# Patient Record
Sex: Female | Born: 1970 | Race: White | Hispanic: No | Marital: Single | Smoking: Never smoker
Health system: Southern US, Community
[De-identification: ages and names within clinical notes are randomized; demographics above are authoritative.]

## PROBLEM LIST (undated history)

## (undated) DIAGNOSIS — K219 Gastro-esophageal reflux disease without esophagitis: Secondary | ICD-10-CM

## (undated) DIAGNOSIS — E119 Type 2 diabetes mellitus without complications: Secondary | ICD-10-CM

## (undated) DIAGNOSIS — F329 Major depressive disorder, single episode, unspecified: Secondary | ICD-10-CM

## (undated) DIAGNOSIS — Z9889 Other specified postprocedural states: Secondary | ICD-10-CM

## (undated) DIAGNOSIS — R112 Nausea with vomiting, unspecified: Secondary | ICD-10-CM

## (undated) DIAGNOSIS — I10 Essential (primary) hypertension: Secondary | ICD-10-CM

## (undated) DIAGNOSIS — F32A Depression, unspecified: Secondary | ICD-10-CM

## (undated) DIAGNOSIS — T4145XA Adverse effect of unspecified anesthetic, initial encounter: Secondary | ICD-10-CM

## (undated) DIAGNOSIS — T8859XA Other complications of anesthesia, initial encounter: Secondary | ICD-10-CM

## (undated) HISTORY — PX: BREAST REDUCTION SURGERY: SHX8

## (undated) HISTORY — PX: FIBULA FRACTURE SURGERY: SHX947

## (undated) HISTORY — PX: ELBOW FRACTURE SURGERY: SHX616

## (undated) HISTORY — PX: REDUCTION MAMMAPLASTY: SUR839

---

## 2016-04-24 NOTE — Pre-Procedure Instructions (Signed)
   Jacqueline KleinKaryn Brock  04/24/2016      [facility] - [place] - [address] [place] [id number] Phone: [phone] Fax: [phone]    Your procedure is scheduled on Thursday, November 30th, 2017.  [facility] at 1:45 P.M.   Call this number if you have problems the morning of surgery:  [phone]   Remember:  Do not eat food or drink liquids after midnight.   Take these medicines the morning of surgery with A SIP OF WATER: Acetaminophen (Tylenol) if needed, Cetirizine (Zyrtec).   Do not wear jewelry, make-up or nail polish.  Do not wear lotions, powders, or perfumes, or deoderant.  Do not shave 48 hours prior to surgery.    Do not bring valuables to the hospital.  [facility] any belongings or valuables.  Contacts, dentures or bridgework may not be worn into surgery.  Leave your suitcase in the car.  After surgery it may be brought to your room.  For patients admitted to the hospital, discharge time will be determined by your treatment team.  Patients discharged the day of surgery will not be allowed to drive home.   Special instructions:  Preparing for Surgery.   [place]- Preparing For Surgery  Before surgery, you can play an important role. Because skin is not sterile, your skin needs to be as free of germs as possible. You can reduce the number of germs on your skin by washing with CHG (chlorahexidine gluconate) Soap before surgery.  CHG is an antiseptic cleaner which kills germs and bonds with the skin to continue killing germs even after washing.  Please do not use if you have an allergy to CHG or antibacterial soaps. If your skin becomes reddened/irritated stop using the CHG.  Do not shave (including legs and underarms) for at least 48 hours prior to first CHG shower. It is OK to shave your face.  Please follow these instructions carefully.   1. Shower the NIGHT BEFORE  SURGERY and the MORNING OF SURGERY with CHG.   2. If you chose to wash your hair, wash your hair first as usual with your normal shampoo.  3. After you shampoo, rinse your hair and body thoroughly to remove the shampoo.  4. Use CHG as you would any other liquid soap. You can apply CHG directly to the skin and wash gently with a scrungie or a clean washcloth.   5. Apply the CHG Soap to your body ONLY FROM THE NECK DOWN.  Do not use on open wounds or open sores. Avoid contact with your eyes, ears, mouth and genitals (private parts). Wash genitals (private parts) with your normal soap.  6. Wash thoroughly, paying special attention to the area where your surgery will be performed.  7. Thoroughly rinse your body with warm water from the neck down.  8. DO NOT shower/wash with your normal soap after using and rinsing off the CHG Soap.  9. Pat yourself dry with a CLEAN TOWEL.   10. Wear CLEAN PAJAMAS   11. Place CLEAN SHEETS on your bed the night of your first shower and DO NOT SLEEP WITH PETS.  Day of Surgery: Do not apply any deodorants/lotions. Please wear clean clothes to the hospital/surgery center.    Please read over the following fact sheets that you were given.

## 2016-04-26 ENCOUNTER — Ambulatory Visit: Payer: Self-pay | Admitting: Orthopedic Surgery

## 2016-04-27 ENCOUNTER — Encounter (HOSPITAL_COMMUNITY)
Admission: RE | Admit: 2016-04-27 | Discharge: 2016-04-27 | Disposition: A | Discharge status: Home / Self Care | Payer: BLUE CROSS/BLUE SHIELD | Source: Ambulatory Visit | Attending: Orthopedic Surgery | Admitting: Orthopedic Surgery

## 2016-04-27 ENCOUNTER — Encounter (HOSPITAL_COMMUNITY): Payer: Self-pay | Admitting: General Practice

## 2016-04-27 DIAGNOSIS — K219 Gastro-esophageal reflux disease without esophagitis: Secondary | ICD-10-CM | POA: Diagnosis not present

## 2016-04-27 DIAGNOSIS — Z01818 Encounter for other preprocedural examination: Secondary | ICD-10-CM | POA: Insufficient documentation

## 2016-04-27 DIAGNOSIS — S42292K Other displaced fracture of upper end of left humerus, subsequent encounter for fracture with nonunion: Secondary | ICD-10-CM | POA: Diagnosis present

## 2016-04-27 DIAGNOSIS — F329 Major depressive disorder, single episode, unspecified: Secondary | ICD-10-CM | POA: Diagnosis not present

## 2016-04-27 DIAGNOSIS — Z6841 Body Mass Index (BMI) 40.0 and over, adult: Secondary | ICD-10-CM | POA: Diagnosis not present

## 2016-04-27 DIAGNOSIS — X58XXXD Exposure to other specified factors, subsequent encounter: Secondary | ICD-10-CM | POA: Diagnosis not present

## 2016-04-27 DIAGNOSIS — I10 Essential (primary) hypertension: Secondary | ICD-10-CM | POA: Diagnosis not present

## 2016-04-27 HISTORY — DX: Other specified postprocedural states: Z98.890

## 2016-04-27 HISTORY — DX: Depression, unspecified: F32.A

## 2016-04-27 HISTORY — DX: Essential (primary) hypertension: I10

## 2016-04-27 HISTORY — DX: Other complications of anesthesia, initial encounter: T88.59XA

## 2016-04-27 HISTORY — DX: Major depressive disorder, single episode, unspecified: F32.9

## 2016-04-27 HISTORY — DX: Adverse effect of unspecified anesthetic, initial encounter: T41.45XA

## 2016-04-27 HISTORY — DX: Nausea with vomiting, unspecified: R11.2

## 2016-04-27 HISTORY — DX: Gastro-esophageal reflux disease without esophagitis: K21.9

## 2016-04-27 LAB — HCG, SERUM, QUALITATIVE: Preg, Serum: NEGATIVE

## 2016-04-27 LAB — BASIC METABOLIC PANEL
Anion gap: 10 (ref 5–15)
BUN: 10 mg/dL (ref 6–20)
CO2: 22 mmol/L (ref 22–32)
Calcium: 9.1 mg/dL (ref 8.9–10.3)
Chloride: 104 mmol/L (ref 101–111)
Creatinine, Ser: 0.61 mg/dL (ref 0.44–1.00)
GFR calc Af Amer: >60 mL/min (ref >60)
GFR calc non Af Amer: >60 mL/min (ref >60)
Glucose, Bld: 133 mg/dL — ABNORMAL HIGH (ref 65–99)
Potassium: 3.9 mmol/L (ref 3.5–5.1)
Sodium: 136 mmol/L (ref 135–145)

## 2016-04-27 LAB — CBC
HCT: 39.1 % (ref 36.0–46.0)
Hemoglobin: 12.9 g/dL (ref 12.0–15.0)
MCH: 26.4 pg (ref 26.0–34.0)
MCHC: 33 g/dL (ref 30.0–36.0)
MCV: 80 fL (ref 78.0–100.0)
Platelets: 285 10*3/uL (ref 150–400)
RBC: 4.89 MIL/uL (ref 3.87–5.11)
RDW: 15.1 % (ref 11.5–15.5)
WBC: 9.7 10*3/uL (ref 4.0–10.5)

## 2016-04-27 NOTE — Progress Notes (Signed)
PCP - Dr. Eyvonne Mechanicarolina Prochnau Cardiologist - denies  EKG -06/08/15 [facility] - denies Echo/stress test/cardiac cath - denies  Patient denies chest pain and shortness of breath at PAT appointment.

## 2016-04-29 MED ORDER — DEXTROSE 5 % IV SOLN
3.0000 g | INTRAVENOUS | Status: AC
  Administered 2016-04-30: 3 g via INTRAVENOUS
  Filled 2016-04-29: qty 3000

## 2016-04-30 ENCOUNTER — Ambulatory Visit (HOSPITAL_COMMUNITY): Payer: BLUE CROSS/BLUE SHIELD | Admitting: Anesthesiology

## 2016-04-30 ENCOUNTER — Encounter (HOSPITAL_COMMUNITY): Payer: Self-pay | Admitting: Surgery

## 2016-04-30 ENCOUNTER — Encounter (HOSPITAL_COMMUNITY)
Admission: RE | Disposition: A | Discharge status: Home / Self Care | Payer: Self-pay | Source: Ambulatory Visit | Attending: Orthopedic Surgery

## 2016-04-30 ENCOUNTER — Observation Stay (HOSPITAL_COMMUNITY)
Admission: RE | Admit: 2016-04-30 | Discharge: 2016-05-02 | Disposition: A | Discharge status: Home / Self Care | Payer: BLUE CROSS/BLUE SHIELD | Source: Ambulatory Visit | Attending: Orthopedic Surgery | Admitting: Orthopedic Surgery

## 2016-04-30 DIAGNOSIS — K219 Gastro-esophageal reflux disease without esophagitis: Secondary | ICD-10-CM | POA: Insufficient documentation

## 2016-04-30 DIAGNOSIS — I10 Essential (primary) hypertension: Secondary | ICD-10-CM | POA: Diagnosis not present

## 2016-04-30 DIAGNOSIS — S42292K Other displaced fracture of upper end of left humerus, subsequent encounter for fracture with nonunion: Principal | ICD-10-CM | POA: Insufficient documentation

## 2016-04-30 DIAGNOSIS — S42209K Unspecified fracture of upper end of unspecified humerus, subsequent encounter for fracture with nonunion: Secondary | ICD-10-CM | POA: Diagnosis present

## 2016-04-30 DIAGNOSIS — Z6841 Body Mass Index (BMI) 40.0 and over, adult: Secondary | ICD-10-CM | POA: Insufficient documentation

## 2016-04-30 DIAGNOSIS — S42302A Unspecified fracture of shaft of humerus, left arm, initial encounter for closed fracture: Secondary | ICD-10-CM

## 2016-04-30 DIAGNOSIS — X58XXXD Exposure to other specified factors, subsequent encounter: Secondary | ICD-10-CM | POA: Insufficient documentation

## 2016-04-30 DIAGNOSIS — F329 Major depressive disorder, single episode, unspecified: Secondary | ICD-10-CM | POA: Diagnosis not present

## 2016-04-30 HISTORY — PX: ORIF HUMERUS FRACTURE: SHX2126

## 2016-04-30 SURGERY — OPEN REDUCTION INTERNAL FIXATION (ORIF) PROXIMAL HUMERUS FRACTURE
Anesthesia: Regional | Site: Arm Upper | Laterality: Left

## 2016-04-30 MED ORDER — DIPHENHYDRAMINE HCL 25 MG PO CAPS: 25.0000 mg | ORAL_CAPSULE | Freq: Four times a day (QID) | ORAL | Status: DC | PRN

## 2016-04-30 MED ORDER — SUGAMMADEX SODIUM 200 MG/2ML IV SOLN
INTRAVENOUS | Status: DC | PRN
  Administered 2016-04-30: 281.2 mg via INTRAVENOUS

## 2016-04-30 MED ORDER — MIDAZOLAM HCL 2 MG/2ML IJ SOLN
INTRAMUSCULAR | Status: DC | PRN
  Administered 2016-04-30: 2 mg via INTRAVENOUS

## 2016-04-30 MED ORDER — ADULT MULTIVITAMIN W/MINERALS CH
1.0000 | ORAL_TABLET | Freq: Every day | ORAL | Status: DC
  Filled 2016-04-30 (×2): qty 1

## 2016-04-30 MED ORDER — HYDROCHLOROTHIAZIDE 12.5 MG PO CAPS
12.5000 mg | ORAL_CAPSULE | Freq: Every day | ORAL | Status: DC
  Administered 2016-04-30 – 2016-05-01 (×2): 12.5 mg via ORAL
  Filled 2016-04-30 (×2): qty 1

## 2016-04-30 MED ORDER — OXYCODONE HCL 5 MG PO TABS: 5.0000 mg | ORAL_TABLET | Freq: Once | ORAL | Status: DC | PRN

## 2016-04-30 MED ORDER — BUPIVACAINE-EPINEPHRINE (PF) 0.5% -1:200000 IJ SOLN
INTRAMUSCULAR | Status: DC | PRN
  Administered 2016-04-30: 15 mL via PERINEURAL

## 2016-04-30 MED ORDER — OXYCODONE HCL 5 MG PO TABS
5.0000 mg | ORAL_TABLET | ORAL | Status: DC | PRN
  Administered 2016-05-01: 10 mg via ORAL
  Administered 2016-05-01 (×4): 5 mg via ORAL
  Administered 2016-05-02 (×2): 10 mg via ORAL
  Filled 2016-04-30 (×3): qty 2
  Filled 2016-04-30 (×4): qty 1

## 2016-04-30 MED ORDER — ONDANSETRON HCL 4 MG/2ML IJ SOLN
4.0000 mg | Freq: Four times a day (QID) | INTRAMUSCULAR | Status: DC | PRN
  Administered 2016-04-30: 4 mg via INTRAVENOUS
  Filled 2016-04-30: qty 2

## 2016-04-30 MED ORDER — METHOCARBAMOL 1000 MG/10ML IJ SOLN
500.0000 mg | Freq: Four times a day (QID) | INTRAVENOUS | Status: DC | PRN
  Filled 2016-04-30: qty 5

## 2016-04-30 MED ORDER — FENTANYL CITRATE (PF) 100 MCG/2ML IJ SOLN
INTRAMUSCULAR | Status: AC
  Filled 2016-04-30: qty 2

## 2016-04-30 MED ORDER — LACTATED RINGERS IV SOLN
INTRAVENOUS | Status: DC | PRN
  Administered 2016-04-30 (×3): via INTRAVENOUS

## 2016-04-30 MED ORDER — CEFAZOLIN IN D5W 1 GM/50ML IV SOLN
1.0000 g | Freq: Three times a day (TID) | INTRAVENOUS | Status: DC
  Administered 2016-05-01 – 2016-05-02 (×5): 1 g via INTRAVENOUS
  Filled 2016-04-30 (×6): qty 50

## 2016-04-30 MED ORDER — IRBESARTAN 150 MG PO TABS
150.0000 mg | ORAL_TABLET | Freq: Every day | ORAL | Status: DC
  Administered 2016-04-30 – 2016-05-01 (×2): 150 mg via ORAL
  Filled 2016-04-30 (×2): qty 1

## 2016-04-30 MED ORDER — SCOPOLAMINE 1 MG/3DAYS TD PT72
MEDICATED_PATCH | TRANSDERMAL | Status: DC | PRN
  Administered 2016-04-30: 1 via TRANSDERMAL

## 2016-04-30 MED ORDER — OXYCODONE HCL 5 MG/5ML PO SOLN: 5.0000 mg | Freq: Once | ORAL | Status: DC | PRN

## 2016-04-30 MED ORDER — ONDANSETRON HCL 4 MG/2ML IJ SOLN
INTRAMUSCULAR | Status: DC | PRN
  Administered 2016-04-30: 4 mg via INTRAVENOUS

## 2016-04-30 MED ORDER — PROPOFOL 10 MG/ML IV BOLUS
INTRAVENOUS | Status: DC | PRN
  Administered 2016-04-30: 200 mg via INTRAVENOUS

## 2016-04-30 MED ORDER — SODIUM CHLORIDE 0.45 % IV SOLN
INTRAVENOUS | Status: DC
  Administered 2016-05-01: 06:00:00 via INTRAVENOUS

## 2016-04-30 MED ORDER — CEFAZOLIN IN D5W 1 GM/50ML IV SOLN
1.0000 g | INTRAVENOUS | Status: AC
  Administered 2016-04-30: 1 g via INTRAVENOUS
  Filled 2016-04-30: qty 50

## 2016-04-30 MED ORDER — VALSARTAN-HYDROCHLOROTHIAZIDE 160-12.5 MG PO TABS: 1.0000 | ORAL_TABLET | Freq: Every day | ORAL | Status: DC

## 2016-04-30 MED ORDER — PHENYLEPHRINE HCL 10 MG/ML IJ SOLN
INTRAMUSCULAR | Status: DC | PRN
  Administered 2016-04-30: 80 ug via INTRAVENOUS

## 2016-04-30 MED ORDER — ESCITALOPRAM OXALATE 10 MG PO TABS
10.0000 mg | ORAL_TABLET | Freq: Every day | ORAL | Status: DC
  Administered 2016-04-30 – 2016-05-01 (×2): 10 mg via ORAL
  Filled 2016-04-30 (×2): qty 1

## 2016-04-30 MED ORDER — LIDOCAINE HCL (CARDIAC) 20 MG/ML IV SOLN
INTRAVENOUS | Status: DC | PRN
  Administered 2016-04-30: 50 mg via INTRAVENOUS

## 2016-04-30 MED ORDER — ALPRAZOLAM 0.5 MG PO TABS: 0.5000 mg | ORAL_TABLET | Freq: Four times a day (QID) | ORAL | Status: DC | PRN

## 2016-04-30 MED ORDER — EPINEPHRINE PF 1 MG/ML IJ SOLN
INTRAMUSCULAR | Status: AC
  Filled 2016-04-30: qty 1

## 2016-04-30 MED ORDER — ONDANSETRON HCL 4 MG PO TABS
4.0000 mg | ORAL_TABLET | Freq: Four times a day (QID) | ORAL | Status: DC | PRN
  Administered 2016-05-01: 4 mg via ORAL
  Filled 2016-04-30: qty 1

## 2016-04-30 MED ORDER — 0.9 % SODIUM CHLORIDE (POUR BTL) OPTIME
TOPICAL | Status: DC | PRN
  Administered 2016-04-30: 1000 mL

## 2016-04-30 MED ORDER — METHOCARBAMOL 500 MG PO TABS
500.0000 mg | ORAL_TABLET | Freq: Four times a day (QID) | ORAL | Status: DC | PRN
  Administered 2016-05-01 (×2): 500 mg via ORAL
  Filled 2016-04-30 (×2): qty 1

## 2016-04-30 MED ORDER — MORPHINE SULFATE (PF) 2 MG/ML IV SOLN: 1.0000 mg | INTRAVENOUS | Status: DC | PRN

## 2016-04-30 MED ORDER — PANTOPRAZOLE SODIUM 40 MG PO TBEC
40.0000 mg | DELAYED_RELEASE_TABLET | Freq: Every day | ORAL | Status: DC
  Administered 2016-04-30 – 2016-05-01 (×2): 40 mg via ORAL
  Filled 2016-04-30 (×2): qty 1

## 2016-04-30 MED ORDER — MIDAZOLAM HCL 2 MG/2ML IJ SOLN
INTRAMUSCULAR | Status: AC
  Filled 2016-04-30: qty 2

## 2016-04-30 MED ORDER — BUPIVACAINE HCL (PF) 0.5 % IJ SOLN
INTRAMUSCULAR | Status: AC
  Filled 2016-04-30: qty 30

## 2016-04-30 MED ORDER — ROCURONIUM BROMIDE 100 MG/10ML IV SOLN
INTRAVENOUS | Status: DC | PRN
  Administered 2016-04-30: 50 mg via INTRAVENOUS

## 2016-04-30 MED ORDER — SENNA 8.6 MG PO TABS
1.0000 | ORAL_TABLET | Freq: Two times a day (BID) | ORAL | Status: DC
  Administered 2016-05-01 (×2): 8.6 mg via ORAL
  Filled 2016-04-30 (×3): qty 1

## 2016-04-30 MED ORDER — HYDROMORPHONE HCL 1 MG/ML IJ SOLN: 0.2500 mg | INTRAMUSCULAR | Status: DC | PRN

## 2016-04-30 MED ORDER — CHLORHEXIDINE GLUCONATE 4 % EX LIQD: 60.0000 mL | Freq: Once | CUTANEOUS | Status: DC

## 2016-04-30 MED ORDER — FENTANYL CITRATE (PF) 100 MCG/2ML IJ SOLN
INTRAMUSCULAR | Status: DC | PRN
  Administered 2016-04-30 (×3): 100 ug via INTRAVENOUS

## 2016-04-30 SURGICAL SUPPLY — 69 items
BANDAGE ELASTIC 4 VELCRO ST LF (GAUZE/BANDAGES/DRESSINGS) ×2 IMPLANT
BANDAGE ELASTIC 6 VELCRO ST LF (GAUZE/BANDAGES/DRESSINGS) ×2 IMPLANT
BIT DRILL QC 3.3X195 (BIT) ×2 IMPLANT
BNDG CONFORM 3 STRL LF (GAUZE/BANDAGES/DRESSINGS) ×6 IMPLANT
BNDG GAUZE ELAST 4 BULKY (GAUZE/BANDAGES/DRESSINGS) ×4 IMPLANT
CAP LOCK NCB (Cap) ×20 IMPLANT
COVER SURGICAL LIGHT HANDLE (MISCELLANEOUS) ×2 IMPLANT
DRAPE C-ARM 42X72 X-RAY (DRAPES) ×2 IMPLANT
DRAPE EXTREMITY T 121X128X90 (DRAPE) ×2 IMPLANT
DRAPE IMP U-DRAPE 54X76 (DRAPES) ×2 IMPLANT
DRAPE INCISE IOBAN 66X45 STRL (DRAPES) ×2 IMPLANT
DRAPE POUCH INSTRU U-SHP 10X18 (DRAPES) ×2 IMPLANT
DRAPE U-SHAPE 47X51 STRL (DRAPES) ×2 IMPLANT
DRSG EMULSION OIL 3X3 NADH (GAUZE/BANDAGES/DRESSINGS) ×2 IMPLANT
DRSG MEPILEX BORDER 4X12 (GAUZE/BANDAGES/DRESSINGS) ×2 IMPLANT
DRSG PAD ABDOMINAL 8X10 ST (GAUZE/BANDAGES/DRESSINGS) ×2 IMPLANT
ELECT REM PT RETURN 9FT ADLT (ELECTROSURGICAL) ×2
ELECTRODE REM PT RTRN 9FT ADLT (ELECTROSURGICAL) ×1 IMPLANT
GAUZE SPONGE 4X4 12PLY STRL (GAUZE/BANDAGES/DRESSINGS) ×2 IMPLANT
GLOVE BIO SURGEON STRL SZ7.5 (GLOVE) ×2 IMPLANT
GLOVE BIOGEL M 8.0 STRL (GLOVE) ×2 IMPLANT
GOWN STRL REUS W/ TWL LRG LVL3 (GOWN DISPOSABLE) ×2 IMPLANT
GOWN STRL REUS W/ TWL XL LVL3 (GOWN DISPOSABLE) ×2 IMPLANT
GOWN STRL REUS W/TWL LRG LVL3 (GOWN DISPOSABLE) ×2
GOWN STRL REUS W/TWL XL LVL3 (GOWN DISPOSABLE) ×2
KIT BASIN OR (CUSTOM PROCEDURE TRAY) ×2 IMPLANT
KIT INFUSE MEDIUM (Orthopedic Implant) ×2 IMPLANT
KIT ROOM TURNOVER OR (KITS) ×2 IMPLANT
MANIFOLD NEPTUNE II (INSTRUMENTS) ×2 IMPLANT
NDL SUT 6 .5 CRC .975X.05 MAYO (NEEDLE) ×1 IMPLANT
NEEDLE 22X1 1/2 (OR ONLY) (NEEDLE) IMPLANT
NEEDLE MAYO TAPER (NEEDLE) ×1
NS IRRIG 1000ML POUR BTL (IV SOLUTION) ×2 IMPLANT
PACK SHOULDER (CUSTOM PROCEDURE TRAY) ×2 IMPLANT
PACK UNIVERSAL I (CUSTOM PROCEDURE TRAY) ×2 IMPLANT
PAD ARMBOARD 7.5X6 YLW CONV (MISCELLANEOUS) ×4 IMPLANT
PAD CAST 4YDX4 CTTN HI CHSV (CAST SUPPLIES) ×1 IMPLANT
PADDING CAST COTTON 4X4 STRL (CAST SUPPLIES) ×1
PADDING CAST COTTON 6X4 STRL (CAST SUPPLIES) ×2 IMPLANT
PLATE FRACTURE NCB PA 174 H12 (Plate) ×2 IMPLANT
SCREW NCB 4.0 22MM (Screw) ×2 IMPLANT
SCREW NCB 4.0 28MM (Screw) ×4 IMPLANT
SCREW NCB 4.0X20MM (Screw) ×6 IMPLANT
SCREW NCB 4.0X24MM (Screw) ×4 IMPLANT
SCREW NCB 4.0X26MM (Screw) ×2 IMPLANT
SLING ARM FOAM STRAP LRG (SOFTGOODS) ×2 IMPLANT
SPLINT FIBERGLASS 3X35 (CAST SUPPLIES) ×2 IMPLANT
SPLINT FIBERGLASS 4X30 (CAST SUPPLIES) ×2 IMPLANT
SPONGE LAP 18X18 X RAY DECT (DISPOSABLE) ×6 IMPLANT
SPONGE LAP 4X18 X RAY DECT (DISPOSABLE) ×4 IMPLANT
STRIP CLOSURE SKIN 1/2X4 (GAUZE/BANDAGES/DRESSINGS) ×4 IMPLANT
SUCTION FRAZIER HANDLE 10FR (MISCELLANEOUS) ×1
SUCTION TUBE FRAZIER 10FR DISP (MISCELLANEOUS) ×1 IMPLANT
SUT BONE WAX W31G (SUTURE) IMPLANT
SUT ETHIBOND NAB CT1 #1 30IN (SUTURE) ×4 IMPLANT
SUT FIBERWIRE #2 38 REV NDL BL (SUTURE)
SUT MNCRL AB 3-0 PS2 18 (SUTURE) ×2 IMPLANT
SUT PROLENE 3 0 PS 1 (SUTURE) ×2 IMPLANT
SUT VIC AB 0 CTB1 27 (SUTURE) ×2 IMPLANT
SUT VIC AB 2-0 CT1 27 (SUTURE) ×2
SUT VIC AB 2-0 CT1 TAPERPNT 27 (SUTURE) ×2 IMPLANT
SUT VIC AB 2-0 CTB1 (SUTURE) ×6 IMPLANT
SUT VICRYL 4-0 PS2 18IN ABS (SUTURE) ×2 IMPLANT
SUTURE FIBERWR#2 38 REV NDL BL (SUTURE) IMPLANT
SYR CONTROL 10ML LL (SYRINGE) ×2 IMPLANT
TOWEL OR 17X24 6PK STRL BLUE (TOWEL DISPOSABLE) ×2 IMPLANT
TOWEL OR 17X26 10 PK STRL BLUE (TOWEL DISPOSABLE) ×2 IMPLANT
WATER STERILE IRR 1000ML POUR (IV SOLUTION) ×2 IMPLANT
YANKAUER SUCT BULB TIP NO VENT (SUCTIONS) IMPLANT

## 2016-04-30 NOTE — H&P (Signed)
Colvin CaroliKaryn Brock is an 45 y.o. female.   Chief Complaint: left humerus nonunion HPI: Patient presents for surgical trearment of of left humerus nonunion  Patient presents for evaluation and treatment of the of their upper extremity predicament. The patient denies neck, back, chest or  abdominal pain. The patient notes that they have no lower extremity problems. The patients primary complaint is noted. We are planning surgical care pathway for the upper extremity.  Past Medical History:  Diagnosis Date  . Complication of anesthesia    difficulty waking up  . Depression   . GERD (gastroesophageal reflux disease)   . Hypertension   . PONV (postoperative nausea and vomiting)    "scopolomine patch works well"    Past Surgical History:  Procedure Laterality Date  . BREAST REDUCTION SURGERY    . ELBOW FRACTURE SURGERY Left    with hardware placement  . FIBULA FRACTURE SURGERY Right    non union with bone graft    No family history on file. Social History:  reports that she has never smoked. She has never used smokeless tobacco. She reports that she drinks alcohol. She reports that she does not use drugs.  Allergies: No Known Allergies  No prescriptions prior to admission.    No results found for this or any previous visit (from the past 48 hour(s)). No results found.  Review of Systems  Constitutional: Negative.   HENT: Negative.   Eyes: Negative.   Respiratory: Negative.   Cardiovascular: Negative.   Gastrointestinal: Negative.   Genitourinary: Negative.   Skin: Negative.     Last menstrual period 04/06/2016. 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 The patient complains of pain in the affected upper extremity.  The patient is noted to have a normal HEENT exam. Lung fields show equal chest expansion and no shortness of breath. Abdomen exam is  nontender without distention. Lower extremity examination does not show any fracture dislocation or blood clot symptoms. Pelvis is stable and the neck and back are stable and nontender. 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 The risk and benefits of surgery to include risk of bleeding, infection, anesthesia,  damage to normal structures and failure of the surgery to accomplish its intended goals of relieving symptoms and restoring function have been discussed in detail. With this in mind we plan to proceed. I have specifically discussed with the patient the pre-and postoperative regime and the dos and don'ts and risk and benefits in great detail. Risk and benefits of surgery also include risk of dystrophy(CRPS), chronic nerve pain, failure of the healing process to go onto completion and other inherent risks of surgery The relavent the pathophysiology of the disease/injury process, as well as the alternatives for treatment and postoperative course of action has been discussed in great detail with the patient who desires to proceed.  We will do everything in our power to help you (the patient) restore function to the upper extremity. It is a pleasure to see this patient today.   [name],Jacqueline Brock, Jacqueline Brock 04/30/2016, 6:42 AM

## 2016-04-30 NOTE — Anesthesia Procedure Notes (Signed)
Procedure Name: Intubation Date/Time: 04/30/2016 5:08 PM Performed by: Shona SimpsonHOLLIS, Jacqueline Brock Pre-anesthesia Checklist: Patient identified, Patient being monitored, Timeout performed, Emergency Drugs available and Suction available Patient Re-evaluated:Patient Re-evaluated prior to inductionOxygen Delivery Method: Circle system utilized Preoxygenation: Pre-oxygenation with 100% oxygen Intubation Type: IV induction Ventilation: Mask ventilation without difficulty Laryngoscope Size: Miller and 2 Grade View: Grade I Tube type: Oral Tube size: 7.0 mm Number of attempts: 1 Airway Equipment and Method: Stylet Placement Confirmation: ETT inserted through vocal cords under direct vision,  positive ETCO2 and breath sounds checked- equal and bilateral Secured at: 21 cm Tube secured with: Tape Dental Injury: Teeth and Oropharynx as per pre-operative assessment

## 2016-04-30 NOTE — Op Note (Signed)
Please see full [phone]  Status post open reduction internal fixation left humerus nonunion with 12 hole  NCB plate from [employer] with 4.0 screws and locking caps. Radial nerve neurolysis and exploration.  Stress radiograph. Bone graft- infuse BMP bone graft and local autologous graft.   Surgeon Dominica SeverinWilliam Manda Holstad Asst. Karie ChimeraBrian Buchanan PA-C

## 2016-04-30 NOTE — Progress Notes (Signed)
Pt awake,alert, pain free., no c/o. BP stable, ST 115-122-Dr R, Fitzgerald here & aware-no new orders, OK to tx pt to floor.

## 2016-04-30 NOTE — Anesthesia Preprocedure Evaluation (Signed)
Anesthesia Evaluation  Patient identified by MRN, date of birth, ID band Patient awake    Reviewed: Allergy & Precautions, NPO status , Patient's Chart, lab work & pertinent test results  History of Anesthesia Complications (+) PONV and history of anesthetic complications  Airway Mallampati: II  TM Distance: >3 FB Neck ROM: Full    Dental  (+) Teeth Intact   Pulmonary neg pulmonary ROS,    breath sounds clear to auscultation       Cardiovascular hypertension, Pt. on medications  Rhythm:Regular     Neuro/Psych PSYCHIATRIC DISORDERS Depression negative neurological ROS     GI/Hepatic Neg liver ROS, GERD  Medicated and Controlled,  Endo/Other  Morbid obesity  Renal/GU negative Renal ROS     Musculoskeletal   Abdominal   Peds  Hematology negative hematology ROS (+)   Anesthesia Other Findings   Reproductive/Obstetrics                             Anesthesia Physical Anesthesia Plan  ASA: II  Anesthesia Plan: General and Regional   Post-op Pain Management:    Induction: Intravenous  Airway Management Planned: Oral ETT  Additional Equipment: None  Intra-op Plan:   Post-operative Plan: Extubation in OR  Informed Consent: I have reviewed the patients History and Physical, chart, labs and discussed the procedure including the risks, benefits and alternatives for the proposed anesthesia with the patient or authorized representative who has indicated his/her understanding and acceptance.   Dental advisory given  Plan Discussed with: Surgeon and CRNA  Anesthesia Plan Comments:         Anesthesia Quick Evaluation

## 2016-04-30 NOTE — Anesthesia Procedure Notes (Signed)
Procedure Name: Intubation Date/Time: 04/30/2016 5:03 PM Performed by: Gwenyth AllegraADAMI, Mehdi Gironda Pre-anesthesia Checklist: Patient identified, Emergency Drugs available, Suction available, Patient being monitored and Timeout performed Patient Re-evaluated:Patient Re-evaluated prior to inductionOxygen Delivery Method: Circle system utilized Preoxygenation: Pre-oxygenation with 100% oxygen Intubation Type: IV induction Ventilation: Mask ventilation without difficulty Laryngoscope Size: Miller and 3 Grade View: Grade I Tube type: Oral Tube size: 7.0 mm Number of attempts: 1 Airway Equipment and Method: Stylet Placement Confirmation: ETT inserted through vocal cords under direct vision Secured at: 21 cm Tube secured with: Tape Dental Injury: Teeth and Oropharynx as per pre-operative assessment

## 2016-04-30 NOTE — Anesthesia Procedure Notes (Signed)
Anesthesia Regional Block:  Interscalene brachial plexus block  Pre-Anesthetic Checklist: ,, timeout performed, Correct Patient, Correct Site, Correct Laterality, Correct Procedure, Correct Position, site marked, Risks and benefits discussed,  Surgical consent,  Pre-op evaluation,  At surgeon's request and post-op pain management  Laterality: Left  Prep: chloraprep       Needles:  Injection technique: Single-shot  Needle Type: Echogenic Needle     Needle Length: 9cm 9 cm Needle Gauge: 21 and 21 G    Additional Needles:  Procedures: ultrasound guided (picture in chart) Interscalene brachial plexus block Narrative:  Start time: 04/30/2016 4:55 PM End time: 04/30/2016 5:00 PM Injection made incrementally with aspirations every 5 mL.  Performed by: Personally  Anesthesiologist: Shona SimpsonHOLLIS, Ezequiel Macauley D  Additional Notes: No immediate complications. Pt tolerated well.

## 2016-04-30 NOTE — Transfer of Care (Signed)
Immediate Anesthesia Transfer of Care Note  Patient: Jacqueline Brock  Procedure(s) Performed: Procedure(s): OPEN REDUCTION INTERNAL FIXATION (ORIF)  LEFT HUMEROUS NONUNION  WITH ALLOGRAFT BONE GRAFT WITH REPAIR AS NECESSARY PROXIMAL HUMERUS FRACTURE (Left)  Patient Location: PACU  Anesthesia Type:GA combined with regional for post-op pain  Level of Consciousness: awake, alert  and oriented  Airway & Oxygen Therapy: Patient Spontanous Breathing and Patient connected to nasal cannula oxygen  Post-op Assessment: Report given to RN and Post -op Vital signs reviewed and stable  Post vital signs: Reviewed and stable  Last Vitals:  Vitals:   04/30/16 1353  BP: 138/67  Pulse: 94  Resp: 20  Temp: 37 C    Last Pain:  Vitals:   04/30/16 1412  TempSrc:   PainSc: 5       Patients Stated Pain Goal: 5 (04/30/16 1412)  Complications: No apparent anesthesia complications

## 2016-04-30 NOTE — Anesthesia Postprocedure Evaluation (Signed)
Anesthesia Post Note  Patient: Colvin CaroliKaryn Cox  Procedure(s) Performed: Procedure(s) (LRB): OPEN REDUCTION INTERNAL FIXATION (ORIF)  LEFT HUMEROUS NONUNION  WITH ALLOGRAFT BONE GRAFT WITH REPAIR AS NECESSARY PROXIMAL HUMERUS FRACTURE (Left)  Patient location during evaluation: PACU Anesthesia Type: General and Regional Level of consciousness: awake and alert Pain management: pain level controlled Vital Signs Assessment: post-procedure vital signs reviewed and stable Respiratory status: spontaneous breathing, nonlabored ventilation, respiratory function stable and patient connected to nasal cannula oxygen Cardiovascular status: blood pressure returned to baseline and stable Postop Assessment: no signs of nausea or vomiting Anesthetic complications: no    Last Vitals:  Vitals:   04/30/16 2100 04/30/16 2120  BP:  119/78  Pulse: (!) 115 87  Resp: (!) 21 18  Temp: 36.9 C 37.1 C    Last Pain:  Vitals:   04/30/16 2120  TempSrc: Oral  PainSc:                  Kennieth RadFitzgerald, Saeed Toren E

## 2016-05-01 DIAGNOSIS — S42292K Other displaced fracture of upper end of left humerus, subsequent encounter for fracture with nonunion: Secondary | ICD-10-CM | POA: Diagnosis not present

## 2016-05-01 NOTE — Progress Notes (Signed)
Subjective: 1 Day Post-Op Procedure(s) (LRB): OPEN REDUCTION INTERNAL FIXATION (ORIF)  LEFT HUMEROUS NONUNION  WITH ALLOGRAFT BONE GRAFT WITH REPAIR AS NECESSARY PROXIMAL HUMERUS FRACTURE (Left) Patient reports pain as mild.   Vital signs are stable patient denies locking popping catching. She moves her fingers beautifully. Radial nerve is intact.  She is tolerating her  diet. She is voiding well. Overall she looks great given the severity of her injury/humeral nonunion and the operation performed yesterday.  Objective: Vital signs in last 24 hours: Temp:  [98.7 F (37.1 C)-99 F (37.2 C)] 99 F (37.2 C) (12/01 2000) Pulse Rate:  [87-106] 100 (12/01 2000) Resp:  [18] 18 (12/01 2000) BP: (119-126)/(65-78) 126/65 (12/01 2000) SpO2:  [96 %-97 %] 97 % (12/01 2000)  Intake/Output from previous day: 11/30 0701 - 12/01 0700 In: 2987.2 [P.O.:270; I.V.:2617.2; IV Piggyback:100] Out: 100 [Blood:100] Intake/Output this shift: No intake/output data recorded.  No results for input(s): HGB in the last 72 hours. No results for input(s): WBC, RBC, HCT, PLT in the last 72 hours. No results for input(s): NA, K, CL, CO2, BUN, CREATININE, GLUCOSE, CALCIUM in the last 72 hours. No results for input(s): LABPT, INR in the last 72 hours. Physical exam she is alert and oriented in no acute distress looking quite well. Once again her radial median and ulnar nerve motor function is intact left upper extremity. No signs of compartments M. She is noted to have a normal exam in terms of capillary refill etc. Neurologically intact ABD soft Neurovascular intact Sensation intact distally Intact pulses distally No cellulitis present Compartment soft  Assessment/Plan: 1 Day Post-Op Procedure(s) (LRB): OPEN REDUCTION INTERNAL FIXATION (ORIF)  LEFT HUMEROUS NONUNION  WITH ALLOGRAFT BONE GRAFT WITH REPAIR AS NECESSARY PROXIMAL HUMERUS FRACTURE (Left) Plan for discharge tomorrow  Continue IV management. Continue  IV antibiotics. Continue pain management.  [name],Algernon Mundie M 05/01/2016, 9:05 PM

## 2016-05-01 NOTE — Evaluation (Signed)
Occupational Therapy Evaluation Patient Details Name: Jacqueline Brock MRN: [id number] DOB: [birth year] Today's Date: 05/01/2016    History of Present Illness Status post open reduction internal fixation left humerus nonunion   Clinical Impression   Pt with decline in function and safety with ADLs and ADL mobility with decreased endurance and limited by pain; decreased/limited L UE function, ROM. 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 Pt's best friend present and can stay with her for 2-3 days after d/c. Pt would benefit from acute OT services to address impairments to increase level of function and safety    Follow Up Recommendations  Home health OT    Equipment Recommendations  Other (comment) (reacher, LH sponge)    Recommendations for Other Services       Precautions / Restrictions Precautions Precautions: Shoulder;Other (comment) (humerus) Type of Shoulder Precautions: soft cast/splint Shoulder Interventions: At all times;Off for dressing/bathing/exercises;Shoulder sling/immobilizer Precaution Booklet Issued: Yes (comment) Precaution Comments: NWB, finger ROM only Restrictions Weight Bearing Restrictions: Yes LUE Weight Bearing: Non weight bearing      Mobility Bed Mobility Overal bed mobility: Needs Assistance Bed Mobility: Supine to Sit;Sit to Supine     Supine to sit: Min assist;HOB elevated Sit to supine: Supervision   General bed mobility comments: assist to elevate trunk  Transfers Overall transfer level: Modified independent Equipment used: None             General transfer comment: no pyhsical assist from OT    Balance Overall balance assessment: No apparent balance deficits (not formally assessed)                                          ADL Overall ADL's : Needs assistance/impaired     Grooming: Wash/dry hands;Wash/dry face;Standing;Set up   Upper Body Bathing:  Minimal assitance   Lower Body Bathing: Minimal assistance   Upper Body Dressing : Minimal assistance   Lower Body Dressing: Minimal assistance   Toilet Transfer: Modified Independent   Toileting- Clothing Manipulation and Hygiene: Minimal assistance;Sit to/from stand       Functional mobility during ADLs: Modified independent General ADL Comments: pt and her friend educated on ADL techniques     Vision  wears contact lenses and glasses, no change from baseline              Pertinent Vitals/Pain Pain Assessment: 0-10 Pain Score: 4  Pain Location: L shoulder Pain Descriptors / Indicators: Constant;Aching;Heaviness Pain Intervention(s): Limited activity within patient's tolerance;Monitored during session;Premedicated before session;Repositioned;Ice applied     Hand Dominance Right   Extremity/Trunk Assessment Upper Extremity Assessment Upper Extremity Assessment: Generalized weakness;LUE deficits/detail LUE Deficits / Details: soft cast/slpint, sling LUE: Unable to fully assess due to immobilization       Cervical / Trunk Assessment Cervical / Trunk Assessment: Normal   Communication Communication Communication: No difficulties   Cognition Arousal/Alertness: Awake/alert Behavior During Therapy: WFL for tasks assessed/performed Overall Cognitive Status: Within Functional Limits for tasks assessed                     General Comments   Pt very pleasant and cooperative, friend very supportive    Exercises   Other Exercises Other Exercises: ROM of L digits         Home Living Family/patient expects to be  discharged to:: Private residence Living Arrangements: Spouse/significant other Available Help at Discharge: Family;Friend(s) Type of Home: House Home Access: Stairs to enter Entergy CorporationEntrance Stairs-Number of Steps: 5 Entrance Stairs-Rails: Right;Left Home Layout: One level     Bathroom Shower/Tub: Tub/shower unit;Walk-in shower   Bathroom Toilet:  Standard     Home Equipment: Crutches;Cane - single point;Walker - 4 wheels;Bedside commode;Tub bench;Wheelchair - manual          Prior Functioning/Environment Level of Independence: Independent        Comments: working full time        OT Problem List: Pain;Decreased activity tolerance;Impaired UE functional use;Decreased knowledge of use of DME or AE   OT Treatment/Interventions: Self-care/ADL training;DME and/or AE instruction;Therapeutic activities;Patient/family education    OT Goals(Current goals can be found in the care plan section) Acute Rehab OT Goals Patient Stated Goal: go home OT Goal Formulation: With patient Time For Goal Achievement: 05/08/16 Potential to Achieve Goals: Good ADL Goals Pt Will Perform Lower Body Bathing: with min guard assist;with supervision;with set-up;with caregiver independent in assisting;sitting/lateral leans;sit to/from stand Pt Will Perform Lower Body Dressing: with min guard assist;with supervision;with set-up;with caregiver independent in assisting;sitting/lateral leans;sit to/from stand Pt Will Perform Toileting - Clothing Manipulation and hygiene: with min guard assist;with supervision;sit to/from stand Additional ADL Goal #1: Pt will perform ROM of L digits as instructed by OT Additional ADL Goal #2: Pt will verbalize and demo correct positioning of L UE for edema mgt and sling wear  OT Frequency: Min 2X/week   Barriers to D/C:    no barriers                     End of Session Equipment Utilized During Treatment: Other (comment) (L UE sling) Nurse Communication: Other (comment) (pt needs larger sling)  Activity Tolerance: Patient tolerated treatment well;Patient limited by pain Patient left: in bed;with call bell/phone within reach;with family/visitor present   Time: [id number] OT Time Calculation (min): 43 min Charges:  OT General Charges $OT Visit: 1 Procedure OT Evaluation $OT Eval Moderate Complexity: 1  Procedure OT Treatments $Self Care/Home Management : 8-22 mins $Therapeutic Activity: 8-22 mins G-Codes: OT G-codes **NOT FOR INPATIENT CLASS** Functional Assessment Tool Used: clinical judgement Functional Limitation: Self care Self Care Current Status (Y7829(G8987): At least 1 percent but less than 20 percent impaired, limited or restricted Self Care Goal Status (F6213(G8988): At least 1 percent but less than 20 percent impaired, limited or restricted  Galen ManilaSpencer, Arby Dahir Jeanette 05/01/2016, 1:44 PM

## 2016-05-01 NOTE — Progress Notes (Signed)
Orthopedic Tech Progress Note Patient Details:  Jacqueline CaroliKaryn Brock Mar 11, 1971 [id number]  Ortho Devices Type of Ortho Device: Arm sling Ortho Device/Splint Location: Pt needed Larger Arm Sling to Left Arm. Pt was given a XL Size Arm Sling. Ortho Device/Splint Interventions: Application, Adjustment   Jacqueline Brock, Jacqueline Brock 05/01/2016, 12:05 PM

## 2016-05-01 NOTE — Progress Notes (Signed)
PT Cancellation Note  Patient Details Name: Jacqueline Brock MRN: [id number] DOB: April 11, 1971   Cancelled Treatment:    Reason Eval/Treat Not Completed: PT screened, no needs identified, will sign off Pt seen by OT and reports no PT needs at this time.  PT to sign off.   Jacqueline Brock,Jacqueline Brock 05/01/2016, 12:30 PM Zenovia JarredKati Robbin Loughmiller, PT, DPT 05/01/2016 Pager: [phone]

## 2016-05-02 DIAGNOSIS — S42292K Other displaced fracture of upper end of left humerus, subsequent encounter for fracture with nonunion: Secondary | ICD-10-CM | POA: Diagnosis not present

## 2016-05-02 MED ORDER — DIPHENHYDRAMINE HCL 25 MG PO CAPS: 25.0000 mg | ORAL_CAPSULE | Freq: Four times a day (QID) | ORAL | 0 refills | Status: DC | PRN

## 2016-05-02 MED ORDER — METHOCARBAMOL 500 MG PO TABS: 500.0000 mg | ORAL_TABLET | Freq: Four times a day (QID) | ORAL | 0 refills | Status: DC | PRN

## 2016-05-02 MED ORDER — OXYCODONE HCL 5 MG PO TABS: 5.0000 mg | ORAL_TABLET | ORAL | 0 refills | Status: DC | PRN

## 2016-05-02 MED ORDER — SULFAMETHOXAZOLE-TRIMETHOPRIM 800-160 MG PO TABS: 1.0000 | ORAL_TABLET | Freq: Two times a day (BID) | ORAL | 0 refills | Status: DC

## 2016-05-02 NOTE — Progress Notes (Signed)
Patient ID: Colvin CaroliKaryn Brock, female   DOB: 04/30/1971, 45 y.o.   MRN: [id number] Patient is alert and oriented in no acute distress.  Vital signs stable afebrile.  She is voiding well. No signs of DVT.  Physical exam shows she is neurovascularly intact to the radial median and ulnar nerve function  At present time she is walking doing well.  Physical examination findings are normal. I'm quite pleased this.  We will discharge her on Bactrim DS 1 by mouth twice a day 10 days. We'll also discharge her on OxyIR 1-2 every 4-6 hours when necessary pain by mouth. We will also discharged on Robaxin 500 mg 1 by mouth every 6 hours when necessary spasm.  She understands vitamin C, Peri-Colace, and our yogurt and probiotic recommendations.  We'll see her back in the office in 2 weeks. We'll call her for an appointment. All questions have been addressed. Risa Auman M.D.

## 2016-05-02 NOTE — Discharge Summary (Signed)
Patient ID: Jacqueline Brock MRN: [id number] DOB/AGE: Jun 18, 1970 45 y.o.  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 Complication of anesthesia    difficulty waking up  . Depression   . GERD (gastroesophageal reflux disease)   . Hypertension   . PONV (postoperative nausea and vomiting)    "scopolomine patch works well"    Surgeries: Procedure(s): OPEN REDUCTION INTERNAL FIXATION (ORIF)  LEFT HUMEROUS NONUNION  WITH ALLOGRAFT BONE GRAFT WITH REPAIR AS NECESSARY PROXIMAL HUMERUS FRACTURE on 04/30/2016   Consultants:   Discharged Condition: Improved  Hospital Course: Jacqueline Brock is an 45 y.o. female who was admitted 04/30/2016 for operative treatment of<principal problem not specified>. Patient has severe unremitting pain that affects sleep, daily activities, and work/hobbies. After pre-op clearance the patient was taken to the operating room on 04/30/2016 and underwent  Procedure(s): OPEN REDUCTION INTERNAL FIXATION (ORIF)  LEFT HUMEROUS NONUNION  WITH ALLOGRAFT BONE GRAFT WITH REPAIR AS NECESSARY PROXIMAL HUMERUS FRACTURE.    Patient was given perioperative antibiotics: Anti-infectives    Start     Dose/Rate Route Frequency Ordered Stop   05/02/16 0000  sulfamethoxazole-trimethoprim (BACTRIM DS,SEPTRA DS) 800-160 MG tablet     1 tablet Oral 2 times daily 05/02/16 0751     05/01/16 0500  ceFAZolin (ANCEF) IVPB 1 g/50 mL premix     1 g 100 mL/hr over 30 Minutes Intravenous Every 8 hours 04/30/16 2124     04/30/16 2230  ceFAZolin (ANCEF) IVPB 1 g/50 mL premix     1 g 100 mL/hr over 30 Minutes Intravenous NOW 04/30/16 2124 05/01/16 0015   04/30/16 0600  ceFAZolin (ANCEF) 3 g in dextrose 5 % 50 mL IVPB     3 g 130 mL/hr over 30 Minutes Intravenous On call to O.R. 04/29/16 1307 04/30/16 1700       Patient  was given sequential compression devices, early ambulation, and chemoprophylaxis to prevent DVT.  Patient benefited maximally from hospital stay and there were no complications.    Recent vital signs: Patient Vitals for the past 24 hrs:  BP Temp Temp src Pulse Resp SpO2  05/02/16 0557 (!) 105/55 98.5 F (36.9 C) Oral 91 18 94 %  05/01/16 2000 126/65 99 F (37.2 C) Oral 100 18 97 %     Recent laboratory studies: No results for input(s): WBC, HGB, HCT, PLT, NA, K, CL, CO2, BUN, CREATININE, GLUCOSE, INR, CALCIUM in the last 72 hours.  Invalid input(s): PT, 2   Discharge Medications:     Medication List    TAKE these medications   acetaminophen 500 MG tablet Commonly known as:  TYLENOL Take 1,000 mg by mouth every 6 (six) hours as needed for mild pain.   cetirizine 10 MG tablet Commonly known as:  ZYRTEC Take 10 mg by mouth daily.   cholecalciferol 1000 units tablet Commonly known as:  VITAMIN D Take 1,000 Units by mouth every evening.   diphenhydrAMINE 25 mg capsule Commonly known as:  BENADRYL Take 1-2 capsules (25-50 mg total) by mouth every 6 (six) hours as needed for itching.   escitalopram 10 MG tablet Commonly known as:  LEXAPRO Take 10 mg by mouth at bedtime.   lansoprazole 30 MG capsule Commonly known as:  PREVACID Take 30 mg by mouth every evening.   methocarbamol 500 MG tablet Commonly known  as:  ROBAXIN Take 1 tablet (500 mg total) by mouth every 6 (six) hours as needed for muscle spasms.   multivitamin with minerals Tabs tablet Take 1 tablet by mouth daily.   oxyCODONE 5 MG immediate release tablet Commonly known as:  Oxy IR/ROXICODONE Take 1-2 tablets (5-10 mg total) by mouth every 4 (four) hours as needed for moderate pain.   PROBIOTIC PO Take 1 tablet by mouth every evening.   sulfamethoxazole-trimethoprim 800-160 MG tablet Commonly known as:  BACTRIM DS,SEPTRA DS Take 1 tablet by mouth 2 (two) times daily.   valsartan-hydrochlorothiazide  160-12.5 MG tablet Commonly known as:  DIOVAN-HCT at bedtime.       Diagnostic Studies: No results found.  Disposition: Final discharge disposition not confirmed  Discharge Instructions    Call MD / Call 911    Complete by:  As directed    If you experience chest pain or shortness of breath, CALL 911 and be transported to the hospital emergency room.  If you develope a fever above 101 F, pus (white drainage) or increased drainage or redness at the wound, or calf pain, call your surgeon's office.   Constipation Prevention    Complete by:  As directed    Drink plenty of fluids.  Prune juice may be helpful.  You may use a stool softener, such as Colace (over the counter) 100 mg twice a day.  Use MiraLax (over the counter) for constipation as needed.   Diet - low sodium heart healthy    Complete by:  As directed    Increase activity slowly as tolerated    Complete by:  As directed       Follow-up Information    Karen Chafe, MD Follow up in 14 day(s).   Specialty:  Orthopedic Surgery Contact information: [address] [place] [address] [phone]        Garry Heater, NP .   Specialty:  Nurse Practitioner Contact information: [address] [place] [address] [phone]           Physician Discharge Summary  Patient ID: Jacqueline Brock MRN: [id number] DOB/AGE: 01-May-1971 44 y.o.  Admit date: 04/30/2016 Discharge date:   Admission Diagnoses: LEFT HUMEROUS NONUNION Past Medical History:  Diagnosis Date  . Complication of anesthesia    difficulty waking up  . Depression   . GERD (gastroesophageal reflux disease)   . Hypertension   . 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 female who was admitted 04/30/2016 with a chief complaint of No chief complaint on file. , and found to have a diagnosis of LEFT HUMEROUS NONUNION.  They were brought to the operating room on 04/30/2016 and underwent Procedure(s): OPEN REDUCTION INTERNAL FIXATION (ORIF)  LEFT HUMEROUS NONUNION  WITH ALLOGRAFT BONE GRAFT WITH REPAIR AS NECESSARY PROXIMAL HUMERUS FRACTURE.    They were given perioperative antibiotics: Anti-infectives    Start     Dose/Rate Route Frequency Ordered Stop   05/02/16 0000  sulfamethoxazole-trimethoprim (BACTRIM DS,SEPTRA DS) 800-160 MG tablet     1 tablet Oral 2 times daily 05/02/16 0751  05/01/16 0500  ceFAZolin (ANCEF) IVPB 1 g/50 mL premix     1 g 100 mL/hr over 30 Minutes Intravenous Every 8 hours 04/30/16 2124     04/30/16 2230  ceFAZolin (ANCEF) IVPB 1 g/50 mL premix     1 g 100 mL/hr over 30 Minutes Intravenous NOW 04/30/16 2124 05/01/16 0015   04/30/16 0600  ceFAZolin (ANCEF) 3 g in dextrose 5 % 50 mL IVPB     3 g 130 mL/hr over 30 Minutes Intravenous On call to O.R. 04/29/16 1307 04/30/16 1700    .  They were given sequential compression devices, early ambulation, and  for DVT prophylaxis.  Recent vital signs: Patient Vitals for the past 24 hrs:  BP Temp Temp src Pulse Resp SpO2  05/02/16 0557 (!) 105/55 98.5 F (36.9 C) Oral 91 18 94 %  05/01/16 2000 126/65 99 F (37.2 C) Oral 100 18 97 %  .  Recent laboratory studies: No results found.  Discharge Medications:     Medication List    TAKE these medications   acetaminophen 500 MG tablet Commonly known as:  TYLENOL Take 1,000 mg by mouth every 6 (six) hours as needed for mild pain.   cetirizine 10 MG tablet Commonly known as:  ZYRTEC Take 10 mg by mouth daily.   cholecalciferol 1000 units tablet Commonly known as:   VITAMIN D Take 1,000 Units by mouth every evening.   diphenhydrAMINE 25 mg capsule Commonly known as:  BENADRYL Take 1-2 capsules (25-50 mg total) by mouth every 6 (six) hours as needed for itching.   escitalopram 10 MG tablet Commonly known as:  LEXAPRO Take 10 mg by mouth at bedtime.   lansoprazole 30 MG capsule Commonly known as:  PREVACID Take 30 mg by mouth every evening.   methocarbamol 500 MG tablet Commonly known as:  ROBAXIN Take 1 tablet (500 mg total) by mouth every 6 (six) hours as needed for muscle spasms.   multivitamin with minerals Tabs tablet Take 1 tablet by mouth daily.   oxyCODONE 5 MG immediate release tablet Commonly known as:  Oxy IR/ROXICODONE Take 1-2 tablets (5-10 mg total) by mouth every 4 (four) hours as needed for moderate pain.   PROBIOTIC PO Take 1 tablet by mouth every evening.   sulfamethoxazole-trimethoprim 800-160 MG tablet Commonly known as:  BACTRIM DS,SEPTRA DS Take 1 tablet by mouth 2 (two) times daily.   valsartan-hydrochlorothiazide 160-12.5 MG tablet Commonly known as:  DIOVAN-HCT at bedtime.       Diagnostic Studies: No results found.  They benefited maximally from their hospital stay and there were no complications.     Disposition: Final discharge disposition not confirmed Discharge Instructions    Call MD / Call 911    Complete by:  As directed    If you experience chest pain or shortness of breath, CALL 911 and be transported to the hospital emergency room.  If you develope a fever above 101 F, pus (white drainage) or increased drainage or redness at the wound, or calf pain, call your surgeon's office.   Constipation Prevention    Complete by:  As directed    Drink plenty of fluids.  Prune juice may be helpful.  You may use a stool softener, such as Colace (over the counter) 100 mg twice a day.  Use MiraLax (over the counter) for constipation as needed.   Diet - low sodium heart healthy    Complete by:  As directed     Increase  activity slowly as tolerated    Complete by:  As directed      Follow-up Information    [name],Jacqueline Wragg M, MD Follow up in 14 day(s).   Specialty:  Orthopedic Surgery Contact information: [address] [place] [address] [phone]            Signed: [name],Jacqueline Brock 05/02/2016, 7:54 AM Signed: [name],Jacqueline Brock 05/02/2016, 7:53 AM

## 2016-05-02 NOTE — Discharge Instructions (Signed)

## 2016-05-04 ENCOUNTER — Encounter (HOSPITAL_COMMUNITY): Payer: Self-pay | Admitting: Orthopedic Surgery

## 2016-05-04 NOTE — Op Note (Signed)
NAMMarnee Brock:  COX, Delma                   ACCOUNT NO.:  [id number]  MEDICAL RECORD NO.:  [id number]  LOCATION:                                 FACILITY:  PHYSICIAN:  Dionne AnoWilliam M. Amanda PeaGramig, M.D.     DATE OF BIRTH:  DATE OF PROCEDURE:  04/30/2016 DATE OF DISCHARGE:  05/02/2016                              OPERATIVE REPORT   PREOPERATIVE DIAGNOSIS:  Chronic left humerus nonunion with instability, pain, and deformity.  POSTOPERATIVE DIAGNOSIS:  Chronic left humerus nonunion with instability, pain, and deformity.  PROCEDURES: 1. Open reduction and internal fixation with autologous bone graft and     Infuse BMP bone graft, left humerus.  A 12-hole NCB plate from     [employer] with 4.0 screws and locking caps were used. 2. Radial nerve neurolysis and exploration, extensive in nature. 3. Stress radiographs/radiographic examination, left humerus.  SURGEON:  Dionne AnoWilliam M. Amanda PeaGramig, M.D.  ASSISTANT:  Karie ChimeraBrian Buchanan, P.A.-C.  COMPLICATIONS:  None.  ANESTHESIA:  General.  TOURNIQUET TIME:  Zero.  ESTIMATED BLOOD LOSS:  Less than 200 mL.  DRAINS:  None.  INDICATIONS:  A 45 year old female with chronic nonunion, she has pain, deformity, and has failed attempts at nonsurgical management.  She desires to proceed understanding and accepting the risks and benefits. I have discussed her all issues, do's and don'ts, timeframe, duration of recovery, and potential problematic features involved with a surgical approach of this magnitude.  OPERATION IN DETAIL:  The patient was seen by myself in the operative theater.  She was given a block while awake by the Anesthesia Department.  She then had a general anesthetic induced.  The patient had a preop in the operating room, Hibiclens pre-scrub 10 minutes followed by 10 minutes surgical Betadine scrub and paint.  Outline marks were made.  Sterile field was secured.  Time-out observed, and the operation commenced with incision anterolaterally.  Dissection was  carried down to the biceps, which was retracted medially.  Brachialis was split in the center portion in the internervous plying, and proximal and distal normal anatomy was identified.  The nonunion was chronic and rather robust.  At this time, we identified the radial nerve distally and then traced this proximally.  Under 4.0 loupe magnification, facial nerve dissector was used to aid in this.  A thorough radial nerve exploration and neurolysis was accomplished without difficulty.  Once the radial nerve was protected, I then turned attention towards the nonunion site. We very carefully took down the nonunion and prepared this for ORIF. Bone chips and autologous graft were safe from a takedown of the distal and proximal ends.  The distal and proximal ends were dealt with periosteal elevator and very careful cautious approach to the takedown. Fibrous tissue was removed, and following this canal opening proximally and distally was reestablished with curette and drill bit.  A good hard cortical bone was noted for fixation on the outer region, and on the inner regions of the proximal and distal fragments, there was good canal reestablishment.  The patient had an oblique pattern and a prior butterfly fragment, which had healed proximally was somewhat prominent on the medial and posteromedial regions.  I made sure this did not encroach upon the radial nerve.  I was very forthright in this.  We then were able to achieve coaptation of the cancellous surfaces against each other and applied a 12-hole plate.  This was a 12-hole NCB [employer] locking plate with 4.0 screws and caps for a locking screw technique.  Following this, I checked the radial nerve once again as we did multiple times during the operation, then irrigated with 4 L of saline.  We then placed Infuse bone graft (BMP) as well as autologous bone graft, which we obtained from the reamings.  I was very pleased with the fixation. The  12-hole plate sat nicely.  We had 5 cortices proximally and 5 cortices distally for sound fixation.  The patient had minimal bleeding. Radial nerve looked excellent following the irrigation.  We then closed the brachialis and made sure the radial nerve was tension free, it was, it looked well.  Following this, we then closed the subcu with Vicryl and the skin edge was closed with Prolene.  This was a running subcuticular Prolene.  Steri-Strips were applied, and a Mepitel dressing followed by long-arm splint and our standard technique was accomplished.  The patient tolerated this well.  I discussed with her family all issues and get them copies of the radiographs.  We will monitor her neurovascular status once the block has time to wear way.  She will be admitted overnight, and we will make a decision as to discharge once we are able to evaluate her on her pain control issues.  Going forward, I would like to see her back in 2 weeks.  We will go ahead and have her in therapy for a long-arm splint and well-arm- assisted range of motion will be begun.  I want to go ahead and begin nerve glides and other measures immediately.  It was an absolute pleasure to see her today.  She had a large amount of adiposity, and thus, we were very meticulous with the irrigation, and we will keep her on antibiotics postop.  Do's and don'ts have been discussed, and all questions have been encouraged and answered.     Dionne AnoWilliam M. Amanda PeaGramig, M.D.     [facility]  D:  04/30/2016  T:  05/01/2016  Job:  [id number]

## 2016-05-22 ENCOUNTER — Encounter (HOSPITAL_COMMUNITY): Payer: Self-pay | Admitting: Orthopedic Surgery

## 2018-01-07 ENCOUNTER — Other Ambulatory Visit: Payer: Self-pay | Admitting: Family

## 2018-01-07 DIAGNOSIS — R5381 Other malaise: Secondary | ICD-10-CM

## 2018-01-18 ENCOUNTER — Other Ambulatory Visit: Payer: Self-pay | Admitting: Family

## 2018-01-18 DIAGNOSIS — E559 Vitamin D deficiency, unspecified: Secondary | ICD-10-CM

## 2018-01-20 ENCOUNTER — Other Ambulatory Visit: Payer: Self-pay | Admitting: Family

## 2018-01-20 DIAGNOSIS — Z1231 Encounter for screening mammogram for malignant neoplasm of breast: Secondary | ICD-10-CM

## 2018-03-10 ENCOUNTER — Ambulatory Visit
Admission: RE | Admit: 2018-03-10 | Discharge: 2018-03-10 | Disposition: A | Discharge status: Home / Self Care | Payer: BLUE CROSS/BLUE SHIELD | Source: Ambulatory Visit | Attending: Family | Admitting: Family

## 2018-03-10 DIAGNOSIS — E559 Vitamin D deficiency, unspecified: Secondary | ICD-10-CM

## 2018-03-10 DIAGNOSIS — Z1231 Encounter for screening mammogram for malignant neoplasm of breast: Secondary | ICD-10-CM

## 2018-03-29 ENCOUNTER — Emergency Department (HOSPITAL_COMMUNITY)
Admission: EM | Admit: 2018-03-29 | Discharge: 2018-03-29 | Disposition: A | Discharge status: Home / Self Care | Payer: BLUE CROSS/BLUE SHIELD | Attending: Emergency Medicine | Admitting: Emergency Medicine

## 2018-03-29 ENCOUNTER — Encounter (HOSPITAL_COMMUNITY): Payer: Self-pay | Admitting: *Deleted

## 2018-03-29 ENCOUNTER — Emergency Department (HOSPITAL_COMMUNITY): Payer: BLUE CROSS/BLUE SHIELD

## 2018-03-29 ENCOUNTER — Other Ambulatory Visit: Payer: Self-pay

## 2018-03-29 DIAGNOSIS — I10 Essential (primary) hypertension: Secondary | ICD-10-CM | POA: Diagnosis not present

## 2018-03-29 DIAGNOSIS — R0789 Other chest pain: Secondary | ICD-10-CM | POA: Diagnosis present

## 2018-03-29 DIAGNOSIS — Z79899 Other long term (current) drug therapy: Secondary | ICD-10-CM | POA: Diagnosis not present

## 2018-03-29 DIAGNOSIS — R079 Chest pain, unspecified: Secondary | ICD-10-CM

## 2018-03-29 LAB — BASIC METABOLIC PANEL
Anion gap: 14 (ref 5–15)
BUN: 9 mg/dL (ref 6–20)
CO2: 23 mmol/L (ref 22–32)
Calcium: 9 mg/dL (ref 8.9–10.3)
Chloride: 101 mmol/L (ref 98–111)
Creatinine, Ser: 0.64 mg/dL (ref 0.44–1.00)
GFR calc Af Amer: >60 mL/min (ref >60)
GFR calc non Af Amer: >60 mL/min (ref >60)
Glucose, Bld: 150 mg/dL — ABNORMAL HIGH (ref 70–99)
Potassium: 4.5 mmol/L (ref 3.5–5.1)
Sodium: 138 mmol/L (ref 135–145)

## 2018-03-29 LAB — I-STAT BETA HCG BLOOD, ED (MC, WL, AP ONLY): I-stat hCG, quantitative: <5 m[IU]/mL (ref <5)

## 2018-03-29 LAB — I-STAT TROPONIN, ED
Troponin i, poc: 0 ng/mL (ref 0.00–0.08)
Troponin i, poc: 0 ng/mL (ref 0.00–0.08)

## 2018-03-29 LAB — CBC
HCT: 38.9 % (ref 36.0–46.0)
Hemoglobin: 11.4 g/dL — ABNORMAL LOW (ref 12.0–15.0)
MCH: 23.3 pg — ABNORMAL LOW (ref 26.0–34.0)
MCHC: 29.3 g/dL — ABNORMAL LOW (ref 30.0–36.0)
MCV: 79.4 fL — ABNORMAL LOW (ref 80.0–100.0)
Platelets: 291 10*3/uL (ref 150–400)
RBC: 4.9 MIL/uL (ref 3.87–5.11)
RDW: 17.2 % — ABNORMAL HIGH (ref 11.5–15.5)
WBC: 7.8 10*3/uL (ref 4.0–10.5)
nRBC: 0 % (ref 0.0–0.2)

## 2018-03-29 LAB — D-DIMER, QUANTITATIVE: D-Dimer, Quant: <0.27 ug/mL-FEU (ref 0.00–0.50)

## 2018-03-29 NOTE — ED Provider Notes (Signed)
[facility] EMERGENCY DEPARTMENT Provider Note  CSN: [id number] Arrival date & time: 03/29/18 4098  Chief Complaint(s) Chest Pain  HPI Jacqueline Brock is a 47 y.o. female   The history is provided by the patient.  Chest Pain   This is a new problem. Episode onset: 1 week. Episode frequency: intermittent. Progression since onset: fluctuating; now pain free. The pain is present in the substernal region. The pain is moderate. The quality of the pain is described as pressure-like. The pain does not radiate. Episode Length: minutes to hours. Exacerbated by: nothing. Pertinent negatives include no back pain, no claudication, no cough, no fever, no irregular heartbeat, no leg pain, no lower extremity edema, no nausea and no shortness of breath. She has tried nothing for the symptoms. Risk factors include obesity and oral contraceptive use.  Her past medical history is significant for hypertension.  Pertinent negatives for past medical history include no CAD, no COPD, no diabetes, no DVT, no hyperlipidemia, no MI, no PE, no strokes and no TIA.  Pertinent negatives for family medical history include: no early MI.  Procedure history is negative for cardiac catheterization and exercise treadmill test.    Past Medical History Past Medical History:  Diagnosis Date  . Complication of anesthesia    difficulty waking up  . Depression   . GERD (gastroesophageal reflux disease)   . Hypertension   . PONV (postoperative nausea and vomiting)    "scopolomine patch works well"   Patient Active Problem List   Diagnosis Date Noted  . 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 Authorizing Provider  escitalopram (LEXAPRO) 20 MG tablet Take 20 mg by mouth daily.   Yes [provider]  lansoprazole (PREVACID) 30 MG capsule Take 30 mg by mouth daily.  02/25/16  Yes  [provider]  losartan-hydrochlorothiazide (HYZAAR) 100-25 MG tablet Take 1 tablet by mouth daily. 03/18/18  Yes [provider]  MELATONIN GUMMIES PO Take 3 mg by mouth at bedtime.   Yes [provider]  Multiple Vitamins-Minerals (ADULT ONE DAILY GUMMIES) CHEW Chew 1 tablet by mouth daily.   Yes [provider]  naproxen sodium (ALEVE) 220 MG tablet Take 220-440 mg by mouth 2 (two) times daily as needed (for pain).   Yes [provider]  Norethin Ace-Eth Estrad-FE (MICROGESTIN FE 1/20 PO) Take 1 tablet by mouth daily.   Yes [provider]  diphenhydrAMINE (BENADRYL) 25 mg capsule Take 1-2 capsules (25-50 mg total) by mouth every 6 (six) hours as needed for itching. Patient not taking: Reported on 03/29/2018 05/02/16   Dominica Severin, MD  methocarbamol (ROBAXIN) 500 MG tablet Take 1 tablet (500 mg total) by mouth every 6 (six) hours as needed for muscle spasms. Patient not taking: Reported on 03/29/2018 05/02/16   Dominica Severin, MD  oxyCODONE (OXY IR/ROXICODONE) 5 MG immediate release tablet Take 1-2 tablets (5-10 mg total) by mouth every 4 (four) hours as needed for moderate pain. Patient not taking: Reported on 03/29/2018 05/02/16   Dominica Severin, MD  sulfamethoxazole-trimethoprim (BACTRIM DS,SEPTRA DS) 800-160 MG tablet Take 1 tablet by mouth 2 (two) times daily. Patient not taking: Reported on 03/29/2018 05/02/16   Dominica Severin, MD  Past Surgical History Past Surgical History:  Procedure Laterality Date  . BREAST REDUCTION SURGERY    . ELBOW FRACTURE SURGERY Left    with hardware placement  . FIBULA FRACTURE SURGERY Right    non union with bone graft  . ORIF HUMERUS FRACTURE Left 04/30/2016   Procedure: OPEN REDUCTION INTERNAL FIXATION (ORIF)  LEFT HUMEROUS NONUNION  WITH ALLOGRAFT BONE GRAFT WITH  REPAIR AS NECESSARY PROXIMAL HUMERUS FRACTURE;  Surgeon: Dominica Severin, MD;  Location: [facility] OR;  Service: Orthopedics;  Laterality: Left;  . REDUCTION MAMMAPLASTY     Family History No family history on file.  Social History Social History   Tobacco Use  . Smoking status: Never Smoker  . Smokeless tobacco: Never Used  Substance Use Topics  . Alcohol use: Yes    Comment: rarely  . Drug use: No   Allergies Patient has no known allergies.  Review of Systems Review of Systems  Constitutional: Negative for fever.  Respiratory: Negative for cough and shortness of breath.   Cardiovascular: Positive for chest pain. Negative for claudication.  Gastrointestinal: Negative for nausea.  Musculoskeletal: Negative for back pain.   All other systems are reviewed and are negative for acute change except as noted in the HPI  Physical Exam Vital Signs  I have reviewed the triage vital signs BP 135/89   Pulse 114   Temp 98.9 F (37.2 C) (Oral)   Resp 15   SpO2 96%   Physical Exam  Constitutional: She is oriented to person, place, and time. She appears well-developed and well-nourished. No distress.  obese  HENT:  Head: Normocephalic and atraumatic.  Nose: Nose normal.  Eyes: Pupils are equal, round, and reactive to light. Conjunctivae and EOM are normal. Right eye exhibits no discharge. Left eye exhibits no discharge. No scleral icterus.  Neck: Normal range of motion. Neck supple.  Cardiovascular: Normal rate and regular rhythm. Exam reveals no gallop and no friction rub.  No murmur heard. Pulmonary/Chest: Effort normal and breath sounds normal. No stridor. No respiratory distress. She has no rales.  Abdominal: Soft. She exhibits no distension. There is no tenderness.  Musculoskeletal: She exhibits no edema or tenderness.  Neurological: She is alert and oriented to person, place, and time.  Skin: Skin is warm and dry. No rash noted. She is not diaphoretic. No erythema.  Psychiatric:  She has a normal mood and affect.  Vitals reviewed.   ED Results and Treatments Labs (all labs ordered are listed, but only abnormal results are displayed) Labs Reviewed  BASIC METABOLIC PANEL - Abnormal; Notable for the following components:      Result Value   Glucose, Bld 150 (*)    All other components within normal limits  CBC - Abnormal; Notable for the following components:   Hemoglobin 11.4 (*)    MCV 79.4 (*)    MCH 23.3 (*)    MCHC 29.3 (*)    RDW 17.2 (*)    All other components within normal limits  D-DIMER, QUANTITATIVE (NOT AT [facility])  I-STAT TROPONIN, ED  I-STAT BETA HCG BLOOD, ED ([facility], [facility], [facility] ONLY)  EKG  EKG Interpretation  Date/Time:  Tuesday March 29 2018 04:24:15 EDT Ventricular Rate:  106 PR Interval:  146 QRS Duration: 68 QT Interval:  346 QTC Calculation: 459 R Axis:   71 Text Interpretation:  Sinus tachycardia Otherwise no significant change Confirmed by Drema Pry [phone]) on 03/29/2018 6:08:34 AM      Radiology Dg Chest 2 View  Result Date: 03/29/2018 CLINICAL DATA:  46 year old female with chest pain. EXAM: CHEST - 2 VIEW COMPARISON:  None. FINDINGS: The heart size and mediastinal contours are within normal limits. Both lungs are clear. The visualized skeletal structures are unremarkable. IMPRESSION: No active cardiopulmonary disease. Electronically Signed   By: Elgie Collard M.D.   On: 03/29/2018 05:28   Pertinent labs & imaging results that were available during my care of the patient were reviewed by me and considered in my medical decision making (see chart for details).  Medications Ordered in ED Medications - No data to display                                                                                                                                  Procedures Procedures  (including critical care  time)  Medical Decision Making / ED Course I have reviewed the nursing notes for this encounter and the patient's prior records (if available in EHR or on provided paperwork).    Atypical chest pain.  EKG without acute ischemic changes or evidence of pericarditis.  Initial troponin negative.  Heart score less than 4.  Appropriate for delta troponin if rest of the work-up is unremarkable.  Low pretest probability for pulmonary embolism per patient is tachycardic and takes OCPs.  Unable to [facility].  Will obtain a dimer. Dimer negative.  Not consistent with aortic dissection or esophageal perforation.  Chest x-ray without evidence suggestive of pneumonia, pneumothorax, pneumomediastinum.  No abnormal contour of the mediastinum to suggest dissection. No evidence of acute injuries.  Pending delta trop. If negative, appropriate for DC.  Patient care turned over to Dr Rubin Payor at 0730. Patient case and results discussed in detail; please see their note for further ED managment.      This chart was dictated using voice recognition software.  Despite best efforts to proofread,  errors can occur which can change the documentation meaning.   Nira Conn, MD 03/29/18 [phone]

## 2018-03-29 NOTE — ED Provider Notes (Signed)
  Physical Exam  BP 125/77   Pulse 91   Temp 98.9 F (37.2 C) (Oral)   Resp (!) 21   SpO2 95%   Physical Exam  ED Course/Procedures     Procedures  MDM  Received patient in signout pending delta troponin.  Second troponin negative.  Patient thinks this could be related to anxiety/stress.  Will discharge home for outpatient follow-up as needed.       Benjiman Core, MD 03/29/18 [phone]

## 2018-03-29 NOTE — ED Triage Notes (Signed)
To ED for eval of chest heaviness for past week intermittently. States she thinks this is stress related - recent divorce and work. Hx of HTN - on meds. No nausea or vomiting. No pain radiation. Pain woke pt up this am. Skin w/d, resp e/u. HTN meds taken pta

## 2018-05-06 DIAGNOSIS — Z0000 Encounter for general adult medical examination without abnormal findings: Secondary | ICD-10-CM | POA: Diagnosis not present

## 2018-05-06 DIAGNOSIS — E119 Type 2 diabetes mellitus without complications: Secondary | ICD-10-CM | POA: Diagnosis not present

## 2018-05-06 DIAGNOSIS — R5383 Other fatigue: Secondary | ICD-10-CM | POA: Diagnosis not present

## 2018-05-06 DIAGNOSIS — E785 Hyperlipidemia, unspecified: Secondary | ICD-10-CM | POA: Diagnosis not present

## 2018-05-27 DIAGNOSIS — J40 Bronchitis, not specified as acute or chronic: Secondary | ICD-10-CM | POA: Diagnosis not present

## 2018-07-14 DIAGNOSIS — F4323 Adjustment disorder with mixed anxiety and depressed mood: Secondary | ICD-10-CM | POA: Diagnosis not present

## 2018-07-15 DIAGNOSIS — Z113 Encounter for screening for infections with a predominantly sexual mode of transmission: Secondary | ICD-10-CM | POA: Diagnosis not present

## 2018-07-15 DIAGNOSIS — Z124 Encounter for screening for malignant neoplasm of cervix: Secondary | ICD-10-CM | POA: Diagnosis not present

## 2018-07-15 DIAGNOSIS — Z01419 Encounter for gynecological examination (general) (routine) without abnormal findings: Secondary | ICD-10-CM | POA: Diagnosis not present

## 2018-07-15 DIAGNOSIS — Z6841 Body Mass Index (BMI) 40.0 and over, adult: Secondary | ICD-10-CM | POA: Diagnosis not present

## 2018-07-28 DIAGNOSIS — F4323 Adjustment disorder with mixed anxiety and depressed mood: Secondary | ICD-10-CM | POA: Diagnosis not present

## 2018-08-09 DIAGNOSIS — F4323 Adjustment disorder with mixed anxiety and depressed mood: Secondary | ICD-10-CM | POA: Diagnosis not present

## 2018-08-23 DIAGNOSIS — F4323 Adjustment disorder with mixed anxiety and depressed mood: Secondary | ICD-10-CM | POA: Diagnosis not present

## 2018-08-30 DIAGNOSIS — F4323 Adjustment disorder with mixed anxiety and depressed mood: Secondary | ICD-10-CM | POA: Diagnosis not present

## 2018-09-06 DIAGNOSIS — F4323 Adjustment disorder with mixed anxiety and depressed mood: Secondary | ICD-10-CM | POA: Diagnosis not present

## 2018-09-13 DIAGNOSIS — F4323 Adjustment disorder with mixed anxiety and depressed mood: Secondary | ICD-10-CM | POA: Diagnosis not present

## 2018-10-11 DIAGNOSIS — F4323 Adjustment disorder with mixed anxiety and depressed mood: Secondary | ICD-10-CM | POA: Diagnosis not present

## 2018-10-14 DIAGNOSIS — R0781 Pleurodynia: Secondary | ICD-10-CM | POA: Diagnosis not present

## 2018-10-14 DIAGNOSIS — S20212A Contusion of left front wall of thorax, initial encounter: Secondary | ICD-10-CM | POA: Diagnosis not present

## 2018-10-18 DIAGNOSIS — F4323 Adjustment disorder with mixed anxiety and depressed mood: Secondary | ICD-10-CM | POA: Diagnosis not present

## 2018-11-01 DIAGNOSIS — F4323 Adjustment disorder with mixed anxiety and depressed mood: Secondary | ICD-10-CM | POA: Diagnosis not present

## 2018-11-07 DIAGNOSIS — I10 Essential (primary) hypertension: Secondary | ICD-10-CM | POA: Diagnosis not present

## 2018-11-07 DIAGNOSIS — E7800 Pure hypercholesterolemia, unspecified: Secondary | ICD-10-CM | POA: Diagnosis not present

## 2018-11-07 DIAGNOSIS — F329 Major depressive disorder, single episode, unspecified: Secondary | ICD-10-CM | POA: Diagnosis not present

## 2018-11-07 DIAGNOSIS — K219 Gastro-esophageal reflux disease without esophagitis: Secondary | ICD-10-CM | POA: Diagnosis not present

## 2018-11-10 DIAGNOSIS — Z20828 Contact with and (suspected) exposure to other viral communicable diseases: Secondary | ICD-10-CM | POA: Diagnosis not present

## 2018-11-15 DIAGNOSIS — F4323 Adjustment disorder with mixed anxiety and depressed mood: Secondary | ICD-10-CM | POA: Diagnosis not present

## 2018-11-25 DIAGNOSIS — Z20828 Contact with and (suspected) exposure to other viral communicable diseases: Secondary | ICD-10-CM | POA: Diagnosis not present

## 2018-11-29 DIAGNOSIS — F4323 Adjustment disorder with mixed anxiety and depressed mood: Secondary | ICD-10-CM | POA: Diagnosis not present

## 2018-12-13 DIAGNOSIS — F4323 Adjustment disorder with mixed anxiety and depressed mood: Secondary | ICD-10-CM | POA: Diagnosis not present

## 2018-12-27 DIAGNOSIS — F4323 Adjustment disorder with mixed anxiety and depressed mood: Secondary | ICD-10-CM | POA: Diagnosis not present

## 2019-01-10 DIAGNOSIS — F4323 Adjustment disorder with mixed anxiety and depressed mood: Secondary | ICD-10-CM | POA: Diagnosis not present

## 2019-01-24 DIAGNOSIS — F4323 Adjustment disorder with mixed anxiety and depressed mood: Secondary | ICD-10-CM | POA: Diagnosis not present

## 2019-02-28 DIAGNOSIS — F4323 Adjustment disorder with mixed anxiety and depressed mood: Secondary | ICD-10-CM | POA: Diagnosis not present

## 2019-03-22 DIAGNOSIS — F4323 Adjustment disorder with mixed anxiety and depressed mood: Secondary | ICD-10-CM | POA: Diagnosis not present

## 2019-03-28 DIAGNOSIS — F4323 Adjustment disorder with mixed anxiety and depressed mood: Secondary | ICD-10-CM | POA: Diagnosis not present

## 2019-04-11 DIAGNOSIS — F4323 Adjustment disorder with mixed anxiety and depressed mood: Secondary | ICD-10-CM | POA: Diagnosis not present

## 2019-04-25 DIAGNOSIS — F4323 Adjustment disorder with mixed anxiety and depressed mood: Secondary | ICD-10-CM | POA: Diagnosis not present

## 2019-05-08 DIAGNOSIS — J019 Acute sinusitis, unspecified: Secondary | ICD-10-CM | POA: Diagnosis not present

## 2019-05-09 DIAGNOSIS — F4323 Adjustment disorder with mixed anxiety and depressed mood: Secondary | ICD-10-CM | POA: Diagnosis not present

## 2019-05-23 DIAGNOSIS — F4323 Adjustment disorder with mixed anxiety and depressed mood: Secondary | ICD-10-CM | POA: Diagnosis not present

## 2019-06-06 DIAGNOSIS — F4323 Adjustment disorder with mixed anxiety and depressed mood: Secondary | ICD-10-CM | POA: Diagnosis not present

## 2019-06-22 DIAGNOSIS — F4323 Adjustment disorder with mixed anxiety and depressed mood: Secondary | ICD-10-CM | POA: Diagnosis not present

## 2019-06-26 DIAGNOSIS — Z1331 Encounter for screening for depression: Secondary | ICD-10-CM | POA: Diagnosis not present

## 2019-06-26 DIAGNOSIS — Z1231 Encounter for screening mammogram for malignant neoplasm of breast: Secondary | ICD-10-CM | POA: Diagnosis not present

## 2019-06-26 DIAGNOSIS — R0982 Postnasal drip: Secondary | ICD-10-CM | POA: Diagnosis not present

## 2019-06-26 DIAGNOSIS — Z13220 Encounter for screening for lipoid disorders: Secondary | ICD-10-CM | POA: Diagnosis not present

## 2019-06-26 DIAGNOSIS — Z0001 Encounter for general adult medical examination with abnormal findings: Secondary | ICD-10-CM | POA: Diagnosis not present

## 2019-06-26 DIAGNOSIS — R000 Tachycardia, unspecified: Secondary | ICD-10-CM | POA: Diagnosis not present

## 2019-07-04 DIAGNOSIS — F4323 Adjustment disorder with mixed anxiety and depressed mood: Secondary | ICD-10-CM | POA: Diagnosis not present

## 2019-07-18 DIAGNOSIS — F4323 Adjustment disorder with mixed anxiety and depressed mood: Secondary | ICD-10-CM | POA: Diagnosis not present

## 2019-08-01 DIAGNOSIS — F4323 Adjustment disorder with mixed anxiety and depressed mood: Secondary | ICD-10-CM | POA: Diagnosis not present

## 2019-08-15 DIAGNOSIS — F4323 Adjustment disorder with mixed anxiety and depressed mood: Secondary | ICD-10-CM | POA: Diagnosis not present

## 2019-08-29 DIAGNOSIS — F4323 Adjustment disorder with mixed anxiety and depressed mood: Secondary | ICD-10-CM | POA: Diagnosis not present

## 2019-08-30 DIAGNOSIS — I10 Essential (primary) hypertension: Secondary | ICD-10-CM | POA: Diagnosis not present

## 2019-08-30 DIAGNOSIS — J302 Other seasonal allergic rhinitis: Secondary | ICD-10-CM | POA: Diagnosis not present

## 2020-03-30 ENCOUNTER — Ambulatory Visit: Payer: Self-pay | Attending: Internal Medicine

## 2020-03-30 DIAGNOSIS — Z23 Encounter for immunization: Secondary | ICD-10-CM

## 2020-03-30 NOTE — Progress Notes (Signed)
   Covid-19 Vaccination Clinic  Name:  Jacqueline Brock    MRN: [id number] DOB: Aug 30, 1970  03/30/2020  [name] was observed post Covid-19 immunization for 15 minutes without incident. She was provided with Vaccine Information Sheet and instruction to access the V-Safe system.   [name] was instructed to call [phone] with any severe reactions post vaccine: Marland Kitchen Difficulty breathing  . Swelling of face and throat  . A fast heartbeat  . A bad rash all over body  . Dizziness and weakness

## 2020-09-11 ENCOUNTER — Other Ambulatory Visit: Payer: Self-pay | Admitting: Family

## 2020-09-11 DIAGNOSIS — Z1231 Encounter for screening mammogram for malignant neoplasm of breast: Secondary | ICD-10-CM

## 2020-11-05 ENCOUNTER — Ambulatory Visit
Admission: RE | Admit: 2020-11-05 | Discharge: 2020-11-05 | Disposition: A | Discharge status: Home / Self Care | Payer: 59 | Source: Ambulatory Visit | Attending: Family | Admitting: Family

## 2020-11-05 ENCOUNTER — Other Ambulatory Visit: Payer: Self-pay

## 2020-11-05 DIAGNOSIS — Z1231 Encounter for screening mammogram for malignant neoplasm of breast: Secondary | ICD-10-CM

## 2021-10-08 ENCOUNTER — Other Ambulatory Visit: Payer: Self-pay | Admitting: Family

## 2021-10-08 DIAGNOSIS — Z1231 Encounter for screening mammogram for malignant neoplasm of breast: Secondary | ICD-10-CM

## 2021-10-29 ENCOUNTER — Other Ambulatory Visit: Payer: Self-pay

## 2021-10-29 ENCOUNTER — Emergency Department (HOSPITAL_COMMUNITY): Payer: 59

## 2021-10-29 ENCOUNTER — Encounter (HOSPITAL_COMMUNITY): Payer: Self-pay | Admitting: Student

## 2021-10-29 ENCOUNTER — Emergency Department (HOSPITAL_COMMUNITY): Payer: 59 | Admitting: Anesthesiology

## 2021-10-29 ENCOUNTER — Emergency Department (EMERGENCY_DEPARTMENT_HOSPITAL): Payer: 59 | Admitting: Anesthesiology

## 2021-10-29 ENCOUNTER — Encounter (HOSPITAL_COMMUNITY)
Admission: EM | Disposition: A | Discharge status: Home / Self Care | Payer: Self-pay | Source: Home / Self Care | Attending: Emergency Medicine

## 2021-10-29 ENCOUNTER — Ambulatory Visit (HOSPITAL_COMMUNITY)
Admission: EM | Admit: 2021-10-29 | Discharge: 2021-10-29 | Disposition: A | Discharge status: Home / Self Care | Payer: 59 | Attending: Emergency Medicine | Admitting: Emergency Medicine

## 2021-10-29 DIAGNOSIS — Z6839 Body mass index (BMI) 39.0-39.9, adult: Secondary | ICD-10-CM | POA: Diagnosis not present

## 2021-10-29 DIAGNOSIS — I10 Essential (primary) hypertension: Secondary | ICD-10-CM | POA: Insufficient documentation

## 2021-10-29 DIAGNOSIS — K8000 Calculus of gallbladder with acute cholecystitis without obstruction: Secondary | ICD-10-CM | POA: Insufficient documentation

## 2021-10-29 DIAGNOSIS — Z79899 Other long term (current) drug therapy: Secondary | ICD-10-CM | POA: Diagnosis not present

## 2021-10-29 DIAGNOSIS — K810 Acute cholecystitis: Secondary | ICD-10-CM | POA: Diagnosis not present

## 2021-10-29 DIAGNOSIS — E119 Type 2 diabetes mellitus without complications: Secondary | ICD-10-CM | POA: Diagnosis not present

## 2021-10-29 DIAGNOSIS — K219 Gastro-esophageal reflux disease without esophagitis: Secondary | ICD-10-CM | POA: Insufficient documentation

## 2021-10-29 HISTORY — PX: CHOLECYSTECTOMY: SHX55

## 2021-10-29 HISTORY — DX: Type 2 diabetes mellitus without complications: E11.9

## 2021-10-29 LAB — CBC WITH DIFFERENTIAL/PLATELET
Abs Immature Granulocytes: 0.09 10*3/uL — ABNORMAL HIGH (ref 0.00–0.07)
Basophils Absolute: 0 10*3/uL (ref 0.0–0.1)
Basophils Relative: 0 %
Eosinophils Absolute: 0.1 10*3/uL (ref 0.0–0.5)
Eosinophils Relative: 0 %
HCT: 42.4 % (ref 36.0–46.0)
Hemoglobin: 14.1 g/dL (ref 12.0–15.0)
Immature Granulocytes: 1 %
Lymphocytes Relative: 10 %
Lymphs Abs: 1.9 10*3/uL (ref 0.7–4.0)
MCH: 26.9 pg (ref 26.0–34.0)
MCHC: 33.3 g/dL (ref 30.0–36.0)
MCV: 80.9 fL (ref 80.0–100.0)
Monocytes Absolute: 1.3 10*3/uL — ABNORMAL HIGH (ref 0.1–1.0)
Monocytes Relative: 7 %
Neutro Abs: 16.2 10*3/uL — ABNORMAL HIGH (ref 1.7–7.7)
Neutrophils Relative %: 82 %
Platelets: 337 10*3/uL (ref 150–400)
RBC: 5.24 MIL/uL — ABNORMAL HIGH (ref 3.87–5.11)
RDW: 14.2 % (ref 11.5–15.5)
WBC: 19.7 10*3/uL — ABNORMAL HIGH (ref 4.0–10.5)
nRBC: 0 % (ref 0.0–0.2)

## 2021-10-29 LAB — GLUCOSE, CAPILLARY
Glucose-Capillary: 181 mg/dL — ABNORMAL HIGH (ref 70–99)
Glucose-Capillary: 182 mg/dL — ABNORMAL HIGH (ref 70–99)

## 2021-10-29 LAB — COMPREHENSIVE METABOLIC PANEL WITH GFR
ALT: 16 U/L (ref 0–44)
AST: 20 U/L (ref 15–41)
Albumin: 3.3 g/dL — ABNORMAL LOW (ref 3.5–5.0)
Alkaline Phosphatase: 33 U/L — ABNORMAL LOW (ref 38–126)
Anion gap: 10 (ref 5–15)
BUN: 7 mg/dL (ref 6–20)
CO2: 24 mmol/L (ref 22–32)
Calcium: 8.8 mg/dL — ABNORMAL LOW (ref 8.9–10.3)
Chloride: 101 mmol/L (ref 98–111)
Creatinine, Ser: 0.63 mg/dL (ref 0.44–1.00)
GFR, Estimated: >60 mL/min
Glucose, Bld: 178 mg/dL — ABNORMAL HIGH (ref 70–99)
Potassium: 3.5 mmol/L (ref 3.5–5.1)
Sodium: 135 mmol/L (ref 135–145)
Total Bilirubin: 1 mg/dL (ref 0.3–1.2)
Total Protein: 6.8 g/dL (ref 6.5–8.1)

## 2021-10-29 LAB — LIPASE, BLOOD: Lipase: 22 U/L (ref 11–51)

## 2021-10-29 LAB — I-STAT BETA HCG BLOOD, ED (MC, WL, AP ONLY): I-stat hCG, quantitative: <5 m[IU]/mL

## 2021-10-29 SURGERY — LAPAROSCOPIC CHOLECYSTECTOMY WITH INTRAOPERATIVE CHOLANGIOGRAM
Anesthesia: General

## 2021-10-29 MED ORDER — OXYCODONE HCL 5 MG PO TABS: 5.0000 mg | ORAL_TABLET | Freq: Four times a day (QID) | ORAL | 0 refills | Status: AC | PRN

## 2021-10-29 MED ORDER — ONDANSETRON HCL 4 MG/2ML IJ SOLN
INTRAMUSCULAR | Status: AC
  Filled 2021-10-29: qty 2

## 2021-10-29 MED ORDER — LACTATED RINGERS IV SOLN: INTRAVENOUS | Status: DC | PRN

## 2021-10-29 MED ORDER — DEXAMETHASONE SODIUM PHOSPHATE 10 MG/ML IJ SOLN
INTRAMUSCULAR | Status: DC | PRN
  Administered 2021-10-29: 5 mg via INTRAVENOUS

## 2021-10-29 MED ORDER — MORPHINE SULFATE (PF) 4 MG/ML IV SOLN
4.0000 mg | Freq: Once | INTRAVENOUS | Status: AC
  Administered 2021-10-29: 4 mg via INTRAVENOUS
  Filled 2021-10-29: qty 1

## 2021-10-29 MED ORDER — METOPROLOL TARTRATE 5 MG/5ML IV SOLN
INTRAVENOUS | Status: AC
  Filled 2021-10-29: qty 5

## 2021-10-29 MED ORDER — FENTANYL CITRATE (PF) 250 MCG/5ML IJ SOLN
INTRAMUSCULAR | Status: AC
  Filled 2021-10-29: qty 5

## 2021-10-29 MED ORDER — BUPIVACAINE HCL (PF) 0.25 % IJ SOLN
INTRAMUSCULAR | Status: AC
Start: 2021-10-29 — End: ?
  Filled 2021-10-29: qty 30

## 2021-10-29 MED ORDER — BUPIVACAINE HCL 0.25 % IJ SOLN
INTRAMUSCULAR | Status: DC | PRN
  Administered 2021-10-29: 30 mL

## 2021-10-29 MED ORDER — LIDOCAINE 2% (20 MG/ML) 5 ML SYRINGE
INTRAMUSCULAR | Status: AC
  Filled 2021-10-29: qty 5

## 2021-10-29 MED ORDER — PROMETHAZINE HCL 25 MG/ML IJ SOLN: 6.2500 mg | INTRAMUSCULAR | Status: DC | PRN

## 2021-10-29 MED ORDER — PIPERACILLIN-TAZOBACTAM 3.375 G IVPB 30 MIN
3.3750 g | Freq: Once | INTRAVENOUS | Status: AC
  Administered 2021-10-29: 3.375 g via INTRAVENOUS
  Filled 2021-10-29: qty 50

## 2021-10-29 MED ORDER — SODIUM CHLORIDE 0.9 % IV SOLN
INTRAVENOUS | Status: DC | PRN
  Administered 2021-10-29: 100 mL

## 2021-10-29 MED ORDER — SODIUM CHLORIDE 0.9 % IR SOLN
Status: DC | PRN
  Administered 2021-10-29: 1000 mL

## 2021-10-29 MED ORDER — INSULIN ASPART 100 UNIT/ML IJ SOLN
INTRAMUSCULAR | Status: AC
  Filled 2021-10-29: qty 1

## 2021-10-29 MED ORDER — SODIUM CHLORIDE 0.9 % IV BOLUS
1000.0000 mL | Freq: Once | INTRAVENOUS | Status: AC
  Administered 2021-10-29: 1000 mL via INTRAVENOUS

## 2021-10-29 MED ORDER — INSULIN ASPART 100 UNIT/ML IJ SOLN
0.0000 [IU] | INTRAMUSCULAR | Status: DC | PRN
  Administered 2021-10-29: 4 [IU] via SUBCUTANEOUS

## 2021-10-29 MED ORDER — 0.9 % SODIUM CHLORIDE (POUR BTL) OPTIME
TOPICAL | Status: DC | PRN
  Administered 2021-10-29: 1000 mL

## 2021-10-29 MED ORDER — CHLORHEXIDINE GLUCONATE 0.12 % MT SOLN
OROMUCOSAL | Status: AC
  Filled 2021-10-29: qty 15

## 2021-10-29 MED ORDER — SCOPOLAMINE 1 MG/3DAYS TD PT72
MEDICATED_PATCH | TRANSDERMAL | Status: AC
  Administered 2021-10-29: 1.5 mg via TRANSDERMAL
  Filled 2021-10-29: qty 1

## 2021-10-29 MED ORDER — FENTANYL CITRATE (PF) 250 MCG/5ML IJ SOLN
INTRAMUSCULAR | Status: DC | PRN
  Administered 2021-10-29: 50 ug via INTRAVENOUS
  Administered 2021-10-29: 100 ug via INTRAVENOUS
  Administered 2021-10-29 (×2): 50 ug via INTRAVENOUS

## 2021-10-29 MED ORDER — ACETAMINOPHEN 500 MG PO TABS
ORAL_TABLET | ORAL | Status: AC
  Administered 2021-10-29: 1000 mg via ORAL
  Filled 2021-10-29: qty 2

## 2021-10-29 MED ORDER — METOPROLOL TARTRATE 5 MG/5ML IV SOLN
INTRAVENOUS | Status: DC | PRN
  Administered 2021-10-29 (×2): 1 mg via INTRAVENOUS
  Administered 2021-10-29: 2 mg via INTRAVENOUS
  Administered 2021-10-29: 1 mg via INTRAVENOUS

## 2021-10-29 MED ORDER — SCOPOLAMINE 1 MG/3DAYS TD PT72: 1.0000 | MEDICATED_PATCH | TRANSDERMAL | Status: DC

## 2021-10-29 MED ORDER — ROCURONIUM BROMIDE 10 MG/ML (PF) SYRINGE
PREFILLED_SYRINGE | INTRAVENOUS | Status: AC
  Filled 2021-10-29: qty 10

## 2021-10-29 MED ORDER — PROPOFOL 10 MG/ML IV BOLUS
INTRAVENOUS | Status: DC | PRN
  Administered 2021-10-29: 150 mg via INTRAVENOUS

## 2021-10-29 MED ORDER — CELECOXIB 200 MG PO CAPS
ORAL_CAPSULE | ORAL | Status: AC
  Administered 2021-10-29: 200 mg via ORAL
  Filled 2021-10-29: qty 1

## 2021-10-29 MED ORDER — MIDAZOLAM HCL 2 MG/2ML IJ SOLN
INTRAMUSCULAR | Status: AC
Start: 2021-10-29 — End: ?
  Filled 2021-10-29: qty 2

## 2021-10-29 MED ORDER — ONDANSETRON HCL 4 MG/2ML IJ SOLN
4.0000 mg | Freq: Once | INTRAMUSCULAR | Status: AC
  Administered 2021-10-29: 4 mg via INTRAVENOUS
  Filled 2021-10-29: qty 2

## 2021-10-29 MED ORDER — CELECOXIB 200 MG PO CAPS: 200.0000 mg | ORAL_CAPSULE | Freq: Once | ORAL | Status: AC

## 2021-10-29 MED ORDER — ONDANSETRON HCL 4 MG/2ML IJ SOLN
INTRAMUSCULAR | Status: DC | PRN
  Administered 2021-10-29: 4 mg via INTRAVENOUS

## 2021-10-29 MED ORDER — SODIUM CHLORIDE 0.9 % IV SOLN
2.0000 g | Freq: Once | INTRAVENOUS | Status: DC
  Filled 2021-10-29: qty 20

## 2021-10-29 MED ORDER — FENTANYL CITRATE (PF) 100 MCG/2ML IJ SOLN: 25.0000 ug | INTRAMUSCULAR | Status: DC | PRN

## 2021-10-29 MED ORDER — SUGAMMADEX SODIUM 200 MG/2ML IV SOLN
INTRAVENOUS | Status: DC | PRN
  Administered 2021-10-29: 200 mg via INTRAVENOUS

## 2021-10-29 MED ORDER — MIDAZOLAM HCL 2 MG/2ML IJ SOLN
INTRAMUSCULAR | Status: DC | PRN
  Administered 2021-10-29: 2 mg via INTRAVENOUS

## 2021-10-29 MED ORDER — PROPOFOL 10 MG/ML IV BOLUS
INTRAVENOUS | Status: AC
  Filled 2021-10-29: qty 20

## 2021-10-29 MED ORDER — ACETAMINOPHEN 500 MG PO TABS: 1000.0000 mg | ORAL_TABLET | Freq: Once | ORAL | Status: AC

## 2021-10-29 MED ORDER — LIDOCAINE 2% (20 MG/ML) 5 ML SYRINGE
INTRAMUSCULAR | Status: DC | PRN
  Administered 2021-10-29: 100 mg via INTRAVENOUS

## 2021-10-29 MED ORDER — DEXAMETHASONE SODIUM PHOSPHATE 10 MG/ML IJ SOLN
INTRAMUSCULAR | Status: AC
  Filled 2021-10-29: qty 1

## 2021-10-29 MED ORDER — ROCURONIUM BROMIDE 10 MG/ML (PF) SYRINGE
PREFILLED_SYRINGE | INTRAVENOUS | Status: DC | PRN
  Administered 2021-10-29: 80 mg via INTRAVENOUS

## 2021-10-29 MED ORDER — MORPHINE SULFATE (PF) 2 MG/ML IV SOLN: 2.0000 mg | INTRAVENOUS | Status: DC | PRN

## 2021-10-29 MED ORDER — IBUPROFEN 800 MG PO TABS: 800.0000 mg | ORAL_TABLET | Freq: Three times a day (TID) | ORAL | 0 refills | Status: AC | PRN

## 2021-10-29 MED ORDER — AMISULPRIDE (ANTIEMETIC) 5 MG/2ML IV SOLN: 10.0000 mg | Freq: Once | INTRAVENOUS | Status: DC | PRN

## 2021-10-29 SURGICAL SUPPLY — 42 items
APPLIER CLIP ROT 10 11.4 M/L (STAPLE) ×2
BAG COUNTER SPONGE SURGICOUNT (BAG) ×2 IMPLANT
BLADE CLIPPER SURG (BLADE) IMPLANT
CANISTER SUCT 3000ML PPV (MISCELLANEOUS) ×2 IMPLANT
CATH CHOLANG 76X19 KUMAR (CATHETERS) ×2 IMPLANT
CHLORAPREP W/TINT 26 (MISCELLANEOUS) ×2 IMPLANT
CLIP APPLIE ROT 10 11.4 M/L (STAPLE) IMPLANT
CLIP LIGATING HEMO O LOK GREEN (MISCELLANEOUS) IMPLANT
COVER MAYO STAND STRL (DRAPES) ×2 IMPLANT
COVER SURGICAL LIGHT HANDLE (MISCELLANEOUS) ×2 IMPLANT
DERMABOND ADVANCED (GAUZE/BANDAGES/DRESSINGS) ×1
DERMABOND ADVANCED .7 DNX12 (GAUZE/BANDAGES/DRESSINGS) ×1 IMPLANT
DRAPE C-ARM 42X120 X-RAY (DRAPES) ×2 IMPLANT
ELECT REM PT RETURN 9FT ADLT (ELECTROSURGICAL) ×2
ELECTRODE REM PT RTRN 9FT ADLT (ELECTROSURGICAL) ×1 IMPLANT
GLOVE BIOGEL PI IND STRL 7.0 (GLOVE) ×1 IMPLANT
GLOVE BIOGEL PI INDICATOR 7.0 (GLOVE) ×1
GLOVE SURG SS PI 7.0 STRL IVOR (GLOVE) ×2 IMPLANT
GOWN STRL REUS W/ TWL LRG LVL3 (GOWN DISPOSABLE) ×3 IMPLANT
GOWN STRL REUS W/TWL LRG LVL3 (GOWN DISPOSABLE) ×3
GRASPER SUT TROCAR 14GX15 (MISCELLANEOUS) ×2 IMPLANT
KIT BASIN OR (CUSTOM PROCEDURE TRAY) ×2 IMPLANT
KIT TURNOVER KIT B (KITS) ×2 IMPLANT
NEEDLE 22X1 1/2 (OR ONLY) (NEEDLE) ×2 IMPLANT
NS IRRIG 1000ML POUR BTL (IV SOLUTION) ×2 IMPLANT
PAD ARMBOARD 7.5X6 YLW CONV (MISCELLANEOUS) ×2 IMPLANT
POUCH RETRIEVAL ECOSAC 10 (ENDOMECHANICALS) ×1 IMPLANT
POUCH RETRIEVAL ECOSAC 10MM (ENDOMECHANICALS) ×1
SCISSORS LAP 5X35 DISP (ENDOMECHANICALS) ×2 IMPLANT
SET CHOLANGIOGRAPH 5 50 .035 (SET/KITS/TRAYS/PACK) ×1 IMPLANT
SET IRRIG TUBING LAPAROSCOPIC (IRRIGATION / IRRIGATOR) ×2 IMPLANT
SET TUBE SMOKE EVAC HIGH FLOW (TUBING) ×2 IMPLANT
SLEEVE ENDOPATH XCEL 5M (ENDOMECHANICALS) ×4 IMPLANT
SPECIMEN JAR SMALL (MISCELLANEOUS) ×2 IMPLANT
STOPCOCK 4 WAY LG BORE MALE ST (IV SETS) ×2 IMPLANT
SUT MNCRL AB 4-0 PS2 18 (SUTURE) ×2 IMPLANT
TOWEL GREEN STERILE (TOWEL DISPOSABLE) ×2 IMPLANT
TOWEL GREEN STERILE FF (TOWEL DISPOSABLE) ×2 IMPLANT
TRAY LAPAROSCOPIC MC (CUSTOM PROCEDURE TRAY) ×2 IMPLANT
TROCAR XCEL 12X100 BLDLESS (ENDOMECHANICALS) ×2 IMPLANT
TROCAR XCEL NON-BLD 5MMX100MML (ENDOMECHANICALS) ×2 IMPLANT
WATER STERILE IRR 1000ML POUR (IV SOLUTION) ×2 IMPLANT

## 2021-10-29 NOTE — Anesthesia Preprocedure Evaluation (Addendum)
Anesthesia Evaluation  Patient identified by MRN, date of birth, ID band Patient awake    Reviewed: Allergy & Precautions, NPO status , Patient's Chart, lab work & pertinent test results  History of Anesthesia Complications (+) PONV and history of anesthetic complications  Airway Mallampati: II  TM Distance: >3 FB Neck ROM: Full    Dental no notable dental hx. (+) Dental Advisory Given   Pulmonary neg pulmonary ROS,    Pulmonary exam normal        Cardiovascular hypertension, Pt. on medications and Pt. on home beta blockers negative cardio ROS Normal cardiovascular exam     Neuro/Psych PSYCHIATRIC DISORDERS Depression negative neurological ROS     GI/Hepatic Neg liver ROS, GERD  Medicated and Controlled,  Endo/Other  diabetesMorbid obesity  Renal/GU negative Renal ROS     Musculoskeletal negative musculoskeletal ROS (+)   Abdominal   Peds  Hematology negative hematology ROS (+)   Anesthesia Other Findings   Reproductive/Obstetrics                            Anesthesia Physical Anesthesia Plan  ASA: 3  Anesthesia Plan: General   Post-op Pain Management: Celebrex PO (pre-op)* and Tylenol PO (pre-op)*   Induction: Intravenous  PONV Risk Score and Plan: 4 or greater and Ondansetron, Midazolam, Scopolamine patch - Pre-op and TIVA  Airway Management Planned: Oral ETT  Additional Equipment:   Intra-op Plan:   Post-operative Plan: Extubation in OR  Informed Consent: I have reviewed the patients History and Physical, chart, labs and discussed the procedure including the risks, benefits and alternatives for the proposed anesthesia with the patient or authorized representative who has indicated his/her understanding and acceptance.     Dental advisory given  Plan Discussed with: Anesthesiologist and CRNA  Anesthesia Plan Comments:        Anesthesia Quick Evaluation

## 2021-10-29 NOTE — ED Notes (Signed)
Patient transported to Ultrasound 

## 2021-10-29 NOTE — Anesthesia Procedure Notes (Signed)
Procedure Name: Intubation Date/Time: 10/29/2021 10:02 AM Performed by: Marena Chancy, CRNA Pre-anesthesia Checklist: Patient identified, Emergency Drugs available, Suction available and Patient being monitored Patient Re-evaluated:Patient Re-evaluated prior to induction Oxygen Delivery Method: Circle System Utilized Preoxygenation: Pre-oxygenation with 100% oxygen Induction Type: IV induction Ventilation: Mask ventilation without difficulty Laryngoscope Size: Miller and 2 Grade View: Grade I Tube type: Oral Tube size: 7.5 mm Number of attempts: 1 Airway Equipment and Method: Stylet and Oral airway Placement Confirmation: ETT inserted through vocal cords under direct vision, positive ETCO2 and breath sounds checked- equal and bilateral Tube secured with: Tape Dental Injury: Teeth and Oropharynx as per pre-operative assessment

## 2021-10-29 NOTE — H&P (Signed)
**Note Jacqueline-Identified via Obfuscation** Reason for Consult:gallstones Referring Provider: Sindee Stucker Jacqueline Brock is an 51 y.o. female.  HPI: 51 yo female with 1 day of epigastric abdominal pain. It came on suddenly. She was vomiting several times yesterday. She also had diarrhea. Pain is "weird" and new so she came to the ED.  Past Medical History:  Diagnosis Date   Complication of anesthesia    difficulty waking up   Depression    GERD (gastroesophageal reflux disease)    Hypertension    PONV (postoperative nausea and vomiting)    "scopolomine patch works well"    Past Surgical History:  Procedure Laterality Date   BREAST REDUCTION SURGERY     ELBOW FRACTURE SURGERY Left    with hardware placement   FIBULA FRACTURE SURGERY Right    non union with bone graft   ORIF HUMERUS FRACTURE Left 04/30/2016   Procedure: OPEN REDUCTION INTERNAL FIXATION (ORIF)  LEFT HUMEROUS NONUNION  WITH ALLOGRAFT BONE GRAFT WITH REPAIR AS NECESSARY PROXIMAL HUMERUS FRACTURE;  Surgeon: Dominica Severin, MD;  Location: [facility] OR;  Service: Orthopedics;  Laterality: Left;   REDUCTION MAMMAPLASTY      Family History  Problem Relation Age of Onset   Breast cancer Neg Hx     Social History:  reports that she has never smoked. She has never used smokeless tobacco. She reports current alcohol use. She reports that she does not use drugs.  Allergies: No Known Allergies  Medications: I have reviewed the patient's current medications.  Results for orders placed or performed during the hospital encounter of 10/29/21 (from the past 48 hour(s))  Lipase, blood     Status: None   Collection Time: 10/29/21  4:35 AM  Result Value Ref Range   Lipase 22 11 - 51 U/L    Comment: Performed at [facility], [address] [place], [place] [address]  Comprehensive metabolic panel     Status: Abnormal   Collection Time: 10/29/21  4:35 AM  Result Value Ref Range   Sodium 135 135 - 145 mmol/L   Potassium 3.5 3.5 - 5.1 mmol/L   Chloride 101 98 - 111  mmol/L   CO2 24 22 - 32 mmol/L   Glucose, Bld 178 (H) 70 - 99 mg/dL    Comment: Glucose reference range applies only to samples taken after fasting for at least 8 hours.   BUN 7 6 - 20 mg/dL   Creatinine, Ser 9.17 0.44 - 1.00 mg/dL   Calcium 8.8 (L) 8.9 - 10.3 mg/dL   Total Protein 6.8 6.5 - 8.1 g/dL   Albumin 3.3 (L) 3.5 - 5.0 g/dL   AST 20 15 - 41 U/L   ALT 16 0 - 44 U/L   Alkaline Phosphatase 33 (L) 38 - 126 U/L   Total Bilirubin 1.0 0.3 - 1.2 mg/dL   GFR, Estimated >91 >50 mL/min    Comment: (NOTE) Calculated using the CKD-EPI Creatinine Equation (2021)    Anion gap 10 5 - 15    Comment: Performed at [facility], [address], [place], [place] [address]  CBC with Differential     Status: Abnormal   Collection Time: 10/29/21  4:35 AM  Result Value Ref Range   WBC 19.7 (H) 4.0 - 10.5 K/uL   RBC 5.24 (H) 3.87 - 5.11 MIL/uL   Hemoglobin 14.1 12.0 - 15.0 g/dL   HCT 48.0 16.5 - 53.7 %   MCV 80.9 80.0 - 100.0 fL   MCH  26.9 26.0 - 34.0 pg   MCHC 33.3 30.0 - 36.0 g/dL   RDW 14.2 09.611.5 - 04.515.5 %   Platelets 337 150 - 400 K/uL   nRBC 0.0 0.0 - 0.2 %   Neut16.1rophils Relative % 82 %   Neutro Abs 16.2 (H) 1.7 - 7.7 K/uL   Lymphocytes Relative 10 %   Lymphs Abs 1.9 0.7 - 4.0 K/uL   Monocytes Relative 7 %   Monocytes Absolute 1.3 (H) 0.1 - 1.0 K/uL   Eosinophils Relative 0 %   Eosinophils Absolute 0.1 0.0 - 0.5 K/uL   Basophils Relative 0 %   Basophils Absolute 0.0 0.0 - 0.1 K/uL   Immature Granulocytes 1 %   Abs Immature Granulocytes 0.09 (H) 0.00 - 0.07 K/uL    Comment: Performed at [facility], [address] [place], [place] [id number]  I-Stat beta hCG blood, ED     Status: None   Collection Time: 10/29/21  4:41 AM  Result Value Ref Range   I-stat hCG, quantitative <5.0 <5 mIU/mL   Comment 3            Comment:   GEST. AGE      CONC.  (mIU/mL)   <=1 WEEK        5 - 50     2 WEEKS       50 - 500     3 WEEKS       100 - 10,000     4 WEEKS     1,000 - 30,000         FEMALE AND NON-PREGNANT FEMALE:     LESS THAN 5 mIU/mL     US Abdomen Limited RUQ (LIVER/GB)  Result Date: 10/29/2021 CLINICAL DATA:  51 year old female with history of abdominal pain. EXAM: ULTRASOUND ABDOMEN LIMITED RIGHT UPPER QUADRANT COMPARISON:  No priors. FINDINGS: Gallbladder: In the neck of the gallbladder there is a 3.5 cm echogenic focus with posterior acoustic shadowing, indicative of a gallstone. This does not appear to be mobile. Gallbladder is moderately distended. Gallbladder wall thickness is mildly increased at 4 mm. Trace volume of pericholecystic fluid. Per report from the sonographer, there was a sonographic Murphy's sign on examination. Common bile duct: Diameter: 5.8 mm Liver: No focal lesion identified. Diffusely increased hepatic echogenicity, indicative of a background of hepatic steatosis. Portal vein is patent on color Doppler imaging with normal direction of blood flow towards the liver. Other: None. IMPRESSION: 1. Study is positive for cholelithiasis with evidence of early acute cholecystitis, as above. Surgical consultation is recommended. 2. In addition, common bile duct is borderline dilated. There is no intrahepatic biliary ductal dilatation noted on today's examination. If there is any clinical concern for choledocholithiasis or other source of biliary obstruction, further evaluation with abdominal MRI with and without IV gadolinium with MRCP should be considered. 3. Hepatic steatosis. Electronically Signed   By: Trudie Reedaniel  Entrikin M.D.   On: 10/29/2021 06:11    Review of Systems  Constitutional: Negative.   HENT: Negative.    Eyes: Negative.   Respiratory: Negative.    Cardiovascular: Negative.   Gastrointestinal:  Positive for abdominal pain, diarrhea, nausea and vomiting.  Genitourinary: Negative.   Musculoskeletal: Negative.   Skin: Negative.   Neurological: Negative.   Endo/Heme/Allergies: Negative.   Psychiatric/Behavioral: Negative.     PE Blood  pressure (!) 170/96, pulse (!) 105, temperature 98 F (36.7 C), resp. rate 20, height 5\' 7"  (1.702 m), weight 136.1 kg, SpO2  100 %. Constitutional: NAD; conversant; no deformities Eyes: Moist conjunctiva; no lid lag; anicteric; PERRL Neck: Trachea midline; no thyromegaly Lungs: Normal respiratory effort; no tactile fremitus CV: RRR; no palpable thrills; no pitting edema GI: Abd tender in epigastrium; no palpable hepatosplenomegaly MSK: Normal gait; no clubbing/cyanosis Psychiatric: Appropriate affect; alert and oriented x3 Lymphatic: No palpable cervical or axillary lymphadenopathy Skin: No major subcutaneous nodules. Warm and dry   Assessment/Plan: 51 yo female with acute cholecystitis -IV abx -We discussed the etiology of her pain, we discussed treatment options and recommended surgery. We discussed details of surgery including general anesthesia, laparoscopic approach, identification of cystic duct and common bile duct. Ligation of cystic duct and cystic artery. Possible need for intraoperative cholangiogram or open procedure. Possible risks of common bile duct injury, liver injury, cystic duct leak, bleeding, infection, post-cholecystectomy syndrome. The patient showed good understanding and all questions were answered -likely outpatient procedure -She notes she wakes up from anesthesia slowly  I reviewed last 24 h vitals and pain scores, last 48 h intake and output, last 24 h labs and trends, and last 24 h imaging results.  This care required high  level of medical decision making.   Jacqueline Brock 10/29/2021, 8:00 AM

## 2021-10-29 NOTE — Op Note (Signed)
PATIENT:  Jacqueline Brock  51 y.o. female  PRE-OPERATIVE DIAGNOSIS:  cholecystitis  POST-OPERATIVE DIAGNOSIS:  cholecystitis  PROCEDURE:  Procedure(s): LAPAROSCOPIC CHOLECYSTECTOMY WITH INTRAOPERATIVE CHOLANGIOGRAM   SURGEON:  Briane Birden, De Blanch, MD   ASSISTANT: none  ANESTHESIA:   local and general  Indications for procedure: [name] is a 51 y.o. female with symptoms of Abdominal pain and Nausea and vomiting consistent with gallbladder disease, Confirmed by ultrasound.  Description of procedure: The patient was brought into the operative suite, placed supine. Anesthesia was administered with endotracheal tube. Patient was strapped in place and foot board was secured. All pressure points were offloaded by foam padding. The patient was prepped and draped in the usual sterile fashion.  A periumbilical incision was made and optical entry was used to enter the abdomen. 2 5 mm trocars were placed on in the right lateral space on in the right subcostal space. A 25mm trocar was placed in the subxiphoid space. Marcaine with Epinephrine was infused to the subxiphoid space and lateral upper right abdomen in the transversus abdominis plane. Next the patient was placed in reverse trendelenberg. The gallbladder appearedacutely inflamed and gangrenous. Omentum was adhered to the gallbladder and was taken down with cautery/blunt dissection and Due to the level of dilation, the gallbladder was evacuated with suction.  The gallbladder was retracted cephalad and lateral. The peritoneum was reflected off the infundibulum working lateral to medial. The cystic duct and cystic artery were identified and further dissection revealed a critical view, due to concern for choledocholithiasis a cholangiogram was performed with ductotomy and cook catheter passed through a separate subcostal stab incision. Normal ductal anatomy was seen The cystic duct and cystic artery were doubly clipped and ligated.   The  gallbladder was removed off the liver bed with cautery. The Gallbladder was placed in a specimen bag. The gallbladder fossa was irrigated and hemostasis was applied with cautery. The gallbladder was removed via the 56mm trocar. The fascial defect was closed with interrupted 0 vicryl suture via laparoscopic trans-fascial suture passer. Pneumoperitoneum was removed, all trocar were removed. All incisions were closed with 4-0 monocryl subcuticular stitch. The patient woke from anesthesia and was brought to PACU in stable condition. All counts were correct  Findings: gangrenous cholecystitis  Specimen: gallbladder  Blood loss: 50 ml  Local anesthesia: 30 ml Marcaine with Epinephrine  Complications: none  PLAN OF CARE: Discharge to home after PACU  PATIENT DISPOSITION:  PACU - hemodynamically stable.  De Blanch [facility], [place]

## 2021-10-29 NOTE — ED Provider Notes (Signed)
[name] EMERGENCY DEPARTMENT Provider Note   CSN: [id number] Arrival date & time: 10/29/21  0417     History  Chief Complaint  Patient presents with   Abdominal Pain    Jacqueline Brock is a 51 y.o. female with a hx of diabetes mellitus, hypertension, and GERD who presents to the ED with complaints of abdominal pain since 0200 AM yesterday, woke her from sleep. Pain is to the RUQ, constant, no alleviating/aggravating factors. Experiencing associated N/V/D, 8 episodes of emesis 5 episodes of diarrhea, non bloody. Has had some chills. Denies fever, hematemesis, melena, hematochezia, dysuria, chest pain or dyspnea. Currently menstruating. No prior abdominal surgeries. Last PO intake was > 24 hours prior other than attempting to eat a saline last night which she did not tolerate.   HPI     Home Medications Prior to Admission medications   Medication Sig Start Date End Date Taking? Authorizing Provider  diphenhydrAMINE (BENADRYL) 25 mg capsule Take 1-2 capsules (25-50 mg total) by mouth every 6 (six) hours as needed for itching. Patient not taking: Reported on 03/29/2018 05/02/16   Dominica Severin, MD  escitalopram (LEXAPRO) 20 MG tablet Take 20 mg by mouth daily.    [provider]  lansoprazole (PREVACID) 30 MG capsule Take 30 mg by mouth daily.  02/25/16   [provider]  losartan-hydrochlorothiazide (HYZAAR) 100-25 MG tablet Take 1 tablet by mouth daily. 03/18/18   [provider]  MELATONIN GUMMIES PO Take 3 mg by mouth at bedtime.    [provider]  methocarbamol (ROBAXIN) 500 MG tablet Take 1 tablet (500 mg total) by mouth every 6 (six) hours as needed for muscle spasms. Patient not taking: Reported on 03/29/2018 05/02/16   Dominica Severin, MD  Multiple Vitamins-Minerals (ADULT ONE DAILY GUMMIES) CHEW Chew 1 tablet by mouth daily.    [provider]  naproxen sodium (ALEVE) 220 MG tablet Take 220-440 mg by mouth 2 (two)  times daily as needed (for pain).    [provider]  Norethin Ace-Eth Estrad-FE (MICROGESTIN FE 1/20 PO) Take 1 tablet by mouth daily.    [provider]  oxyCODONE (OXY IR/ROXICODONE) 5 MG immediate release tablet Take 1-2 tablets (5-10 mg total) by mouth every 4 (four) hours as needed for moderate pain. Patient not taking: Reported on 03/29/2018 05/02/16   Dominica Severin, MD  sulfamethoxazole-trimethoprim (BACTRIM DS,SEPTRA DS) 800-160 MG tablet Take 1 tablet by mouth 2 (two) times daily. Patient not taking: Reported on 03/29/2018 05/02/16   Dominica Severin, MD      Allergies    Patient has no known allergies.    Review of Systems   Review of Systems  Constitutional:  Positive for chills. Negative for fever.  Respiratory:  Negative for shortness of breath.   Cardiovascular:  Negative for chest pain.  Gastrointestinal:  Positive for abdominal pain, diarrhea, nausea and vomiting. Negative for blood in stool.  Genitourinary:  Positive for vaginal bleeding (menstruating). Negative for dysuria.  All other systems reviewed and are negative.  Physical Exam Updated Vital Signs BP (!) 176/108 (BP Location: Right Arm)   Pulse (!) 124   Temp 98 F (36.7 C)   Resp 17   SpO2 99%  Physical Exam Vitals and nursing note reviewed.  Constitutional:      General: She is not in acute distress.    Appearance: She is well-developed. She is not toxic-appearing.  HENT:     Head: Normocephalic and atraumatic.  Eyes:  General:        Right eye: No discharge.        Left eye: No discharge.     Conjunctiva/sclera: Conjunctivae normal.  Cardiovascular:     Rate and Rhythm: Regular rhythm. Tachycardia present.  Pulmonary:     Effort: No respiratory distress.     Breath sounds: Normal breath sounds. No wheezing or rales.  Abdominal:     General: There is no distension.     Palpations: Abdomen is soft.     Tenderness: There is abdominal tenderness in the right upper quadrant  and epigastric area. There is no guarding. Negative signs include McBurney's sign.  Musculoskeletal:     Cervical back: Neck supple.  Skin:    General: Skin is warm and dry.  Neurological:     Mental Status: She is alert.     Comments: Clear speech.   Psychiatric:        Behavior: Behavior normal.    ED Results / Procedures / Treatments   Labs (all labs ordered are listed, but only abnormal results are displayed) Labs Reviewed  CBC WITH DIFFERENTIAL/PLATELET - Abnormal; Notable for the following components:      Result Value   WBC 19.7 (*)    RBC 5.24 (*)    Neutro Abs 16.2 (*)    Monocytes Absolute 1.3 (*)    Abs Immature Granulocytes 0.09 (*)    All other components within normal limits  LIPASE, BLOOD  COMPREHENSIVE METABOLIC PANEL  URINALYSIS, ROUTINE W REFLEX MICROSCOPIC  I-STAT BETA HCG BLOOD, ED ([facility], [facility], [facility] ONLY)    EKG None  Radiology US Abdomen Limited RUQ (LIVER/GB)  Result Date: 10/29/2021 CLINICAL DATA:  51 year old female with history of abdominal pain. EXAM: ULTRASOUND ABDOMEN LIMITED RIGHT UPPER QUADRANT COMPARISON:  No priors. FINDINGS: Gallbladder: In the neck of the gallbladder there is a 3.5 cm echogenic focus with posterior acoustic shadowing, indicative of a gallstone. This does not appear to be mobile. Gallbladder is moderately distended. Gallbladder wall thickness is mildly increased at 4 mm. Trace volume of pericholecystic fluid. Per report from the sonographer, there was a sonographic Murphy's sign on examination. Common bile duct: Diameter: 5.8 mm Liver: No focal lesion identified. Diffusely increased hepatic echogenicity, indicative of a background of hepatic steatosis. Portal vein is patent on color Doppler imaging with normal direction of blood flow towards the liver. Other: None. IMPRESSION: 1. Study is positive for cholelithiasis with evidence of early acute cholecystitis, as above. Surgical consultation is recommended. 2. In addition, common bile  duct is borderline dilated. There is no intrahepatic biliary ductal dilatation noted on today's examination. If there is any clinical concern for choledocholithiasis or other source of biliary obstruction, further evaluation with abdominal MRI with and without IV gadolinium with MRCP should be considered. 3. Hepatic steatosis. Electronically Signed   By: Trudie Reedaniel  Entrikin M.D.   On: 10/29/2021 06:11    Procedures Procedures    Medications Ordered in ED Medications  sodium chloride 0.9 % bolus 1,000 mL (has no administration in time range)  ondansetron (ZOFRAN) injection 4 mg (has no administration in time range)  morphine (PF) 4 MG/ML injection 4 mg (has no administration in time range)    ED Course/ Medical Decision Making/ A&P                           Medical Decision Making Amount and/or Complexity of Data Reviewed Labs: ordered. Radiology: ordered.  Risk  Prescription drug management.   Patient presents to the ED with complaints of abdominal pain. Patient nontoxic appearing, in no apparent distress, vitals with HTN and tachycardia. On exam patient tender to the epigastrium & RUQ.  Will evaluate with labs and RUQ [place]. Analgesics, anti-emetics, and fluids administered.   DDX including but not limited to: cholecystitis, cholelithiasis, choledocholithiasis, cholangitis, pancreatitis, appendicitis, GERD, ectopic pregnancy, viral GI illness, diverticulitis.   Additional history obtained:  Additional history obtained from chart review & nursing note review.   Lab Tests:  I Ordered, reviewed, and interpreted labs, which included:  CBC: leukocytosis @ 19,700 CMP: hyperglycemia without acidosis or anion gap elevation. LFTs WNL Lipase: WNL Preg test: negative.  UA: Pending.   Imaging Studies ordered:  I ordered imaging studies which included RUQ [place], I independently reviewed, formal radiology impression shows:  1. Study is positive for cholelithiasis with evidence of early acute  cholecystitis, as above. Surgical consultation is recommended. 2. In addition, common bile duct is borderline dilated. There is no intrahepatic biliary ductal dilatation noted on today's examination. If there is any clinical concern for choledocholithiasis or other source of biliary obstruction, further evaluation with abdominal MRI with and without IV gadolinium with MRCP should be considered. 3. Hepatic steatosis  In terms of borderline CBG dilation- LFTs & lipase WNL. Cholelithiasis w/ early acute cholecystitis, with her leukocytosis of almost 20,000 and her initial HR in the 120s will give zosyn for abx coverage- discussed w/ Dr. Jacqulyn Bath- in agreement.    06:25: CONSULT: Discussed with general surgeon Dr. Janee Morn- surgery team to see patient this AM.   06:30: RE-EVAL: Patient updated on results & plan of care- in agreement, pain improved.   Portions of this note were generated with Scientist, clinical (histocompatibility and immunogenetics). Dictation errors may occur despite best attempts at proofreading.  Final Clinical Impression(s) / ED Diagnoses Final diagnoses:  Calculus of gallbladder with acute cholecystitis without obstruction    Rx / DC Orders ED Discharge Orders     None         Cherly Anderson, PA-C 10/29/21 9735    Maia Plan, MD 10/30/21 [phone]

## 2021-10-29 NOTE — ED Notes (Signed)
Patient ambulated to restroom with steady gait.

## 2021-10-29 NOTE — Discharge Instructions (Signed)
[facility] ______[facility] LAPAROSCOPIC SURGERY: POST OP INSTRUCTIONS Always review your discharge instruction sheet given to you by the facility where your surgery was performed. IF YOU HAVE DISABILITY OR FAMILY LEAVE FORMS, YOU MUST BRING THEM TO THE OFFICE FOR PROCESSING.   DO NOT GIVE THEM TO YOUR DOCTOR.  A prescription for pain medication may be given to you upon discharge.  Take your pain medication as prescribed, if needed.  If narcotic pain medicine is not needed, then you may take acetaminophen (Tylenol) or ibuprofen (Advil) as needed. Take your usually prescribed medications unless otherwise directed. If you need a refill on your pain medication, please contact your pharmacy.  They will contact our office to request authorization. Prescriptions will not be filled after 5pm or on week-ends. You should follow a light diet the first few days after arrival home, such as soup and crackers, etc.  Be sure to include lots of fluids daily. Most patients will experience some swelling and bruising in the area of the incisions.  Ice packs will help.  Swelling and bruising can take several days to resolve.  It is common to experience some constipation if taking pain medication after surgery.  Increasing fluid intake and taking a stool softener (such as Colace) will usually help or prevent this problem from occurring.  A mild laxative (Milk of Magnesia or Miralax) should be taken according to package instructions if there are no bowel movements after 48 hours. Unless discharge instructions indicate otherwise, you may remove your bandages 24-48 hours after surgery, and you may shower at that time.  You may have steri-strips (small skin tapes) in place directly over the incision.  These strips should be left on the skin for 7-10 days.  If your surgeon used skin glue on the incision, you may shower in 24 hours.  The glue will flake off over the next 2-3 weeks.  Any sutures or staples will be  removed at the office during your follow-up visit. ACTIVITIES:  You may resume regular (light) daily activities beginning the next day--such as daily self-care, walking, climbing stairs--gradually increasing activities as tolerated.  You may have sexual intercourse when it is comfortable.  Refrain from any heavy lifting or straining until approved by your doctor. You may drive when you are no longer taking prescription pain medication, you can comfortably wear a seatbelt, and you can safely maneuver your car and apply brakes. RETURN TO WORK:  __________________________________________________________ You should see your doctor in the office for a follow-up appointment approximately 2-3 weeks after your surgery.  Make sure that you call for this appointment within a day or two after you arrive home to insure a convenient appointment time. OTHER INSTRUCTIONS: __________________________________________________________________________________________________________________________ __________________________________________________________________________________________________________________________ WHEN TO CALL YOUR DOCTOR: Fever over 101.0 Inability to urinate Continued bleeding from incision. Increased pain, redness, or drainage from the incision. Increasing abdominal pain  The clinic staff is available to answer your questions during regular business hours.  Please don't hesitate to call and ask to speak to one of the nurses for clinical concerns.  If you have a medical emergency, go to the nearest emergency room or call 911.  A surgeon from [facility] is always on call at the hospital. [address], [address], [place], [place]  [address] ? [address] [address], [place], [place]   [address] [phone] ? [phone] ? FAX [phone] Web site: [email or web address]  

## 2021-10-29 NOTE — ED Triage Notes (Signed)
Pt from home with RUQ pain with associated n/v/d since yesterday at 0200. Pt unable to tolerate PO intake. Symptoms unrelieved by any OTC meds.

## 2021-10-29 NOTE — Anesthesia Postprocedure Evaluation (Signed)
Anesthesia Post Note  Patient: Jacqueline Brock  Procedure(s) Performed: LAPAROSCOPIC CHOLECYSTECTOMY WITH INTRAOPERATIVE CHOLANGIOGRAM     Patient location during evaluation: PACU Anesthesia Type: General Level of consciousness: sedated Pain management: pain level controlled Vital Signs Assessment: post-procedure vital signs reviewed and stable Respiratory status: spontaneous breathing and respiratory function stable Cardiovascular status: stable Postop Assessment: no apparent nausea or vomiting Anesthetic complications: no   No notable events documented.  Last Vitals:  Vitals:   10/29/21 1146 10/29/21 1201  BP: (!) 161/95 (!) 155/88  Pulse: (!) 103 (!) 103  Resp: (!) 25 (!) 21  Temp:  36.9 C  SpO2: 95% 93%    Last Pain:  Vitals:   10/29/21 1201  TempSrc:   PainSc: 0-No pain                 [name]

## 2021-10-29 NOTE — Transfer of Care (Signed)
Immediate Anesthesia Transfer of Care Note  Patient: Tabia Vonbargen  Procedure(s) Performed: LAPAROSCOPIC CHOLECYSTECTOMY WITH INTRAOPERATIVE CHOLANGIOGRAM  Patient Location: PACU  Anesthesia Type:General  Level of Consciousness: awake, alert  and oriented  Airway & Oxygen Therapy: Patient Spontanous Breathing and Patient connected to nasal cannula oxygen  Post-op Assessment: Report given to RN, Post -op Vital signs reviewed and stable and Patient moving all extremities X 4  Post vital signs: Reviewed and stable  Last Vitals:  Vitals Value Taken Time  BP 161/95 10/29/21 1146  Temp 37.1 C 10/29/21 1131  Pulse 105 10/29/21 1159  Resp 18 10/29/21 1159  SpO2 94 % 10/29/21 1159  Vitals shown include unvalidated device data.  Last Pain:  Vitals:   10/29/21 1131  TempSrc:   PainSc: Asleep         Complications: No notable events documented.

## 2021-10-30 ENCOUNTER — Encounter (HOSPITAL_COMMUNITY): Payer: Self-pay | Admitting: General Surgery

## 2021-10-31 LAB — SURGICAL PATHOLOGY

## 2021-11-07 ENCOUNTER — Ambulatory Visit: Payer: 59

## 2021-11-08 IMAGING — MG MM DIGITAL SCREENING BILAT W/ TOMO AND CAD
8 series · 8 of 24 positions shown · non-contrast
Comparison: Previous exam(s).

CLINICAL DATA: Screening.

EXAM:
DIGITAL SCREENING BILATERAL MAMMOGRAM WITH TOMOSYNTHESIS AND CAD
TECHNIQUE: Bilateral screening digital craniocaudal and mediolateral oblique
mammograms were obtained. Bilateral screening digital breast
tomosynthesis was performed. The images were evaluated with
computer-aided detection.

[L CC synth-2D]
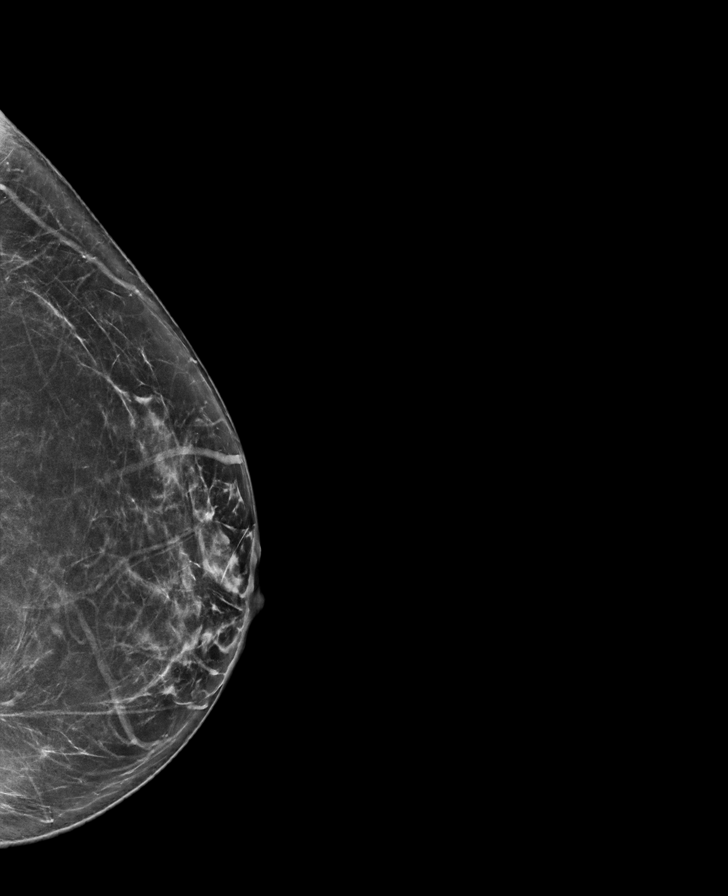

[L MLO synth-2D]
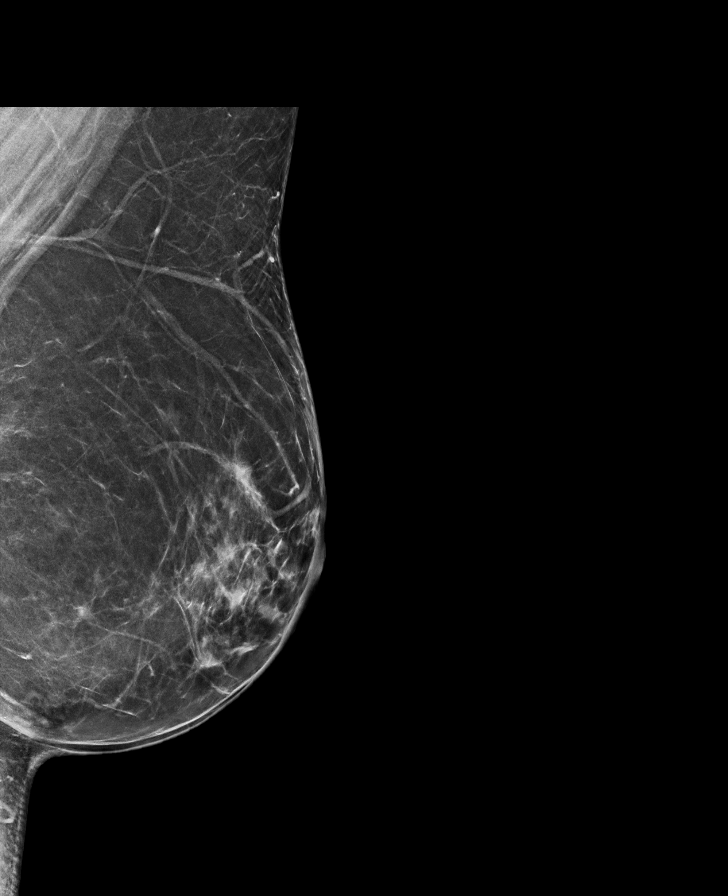

[R MLO synth-2D]
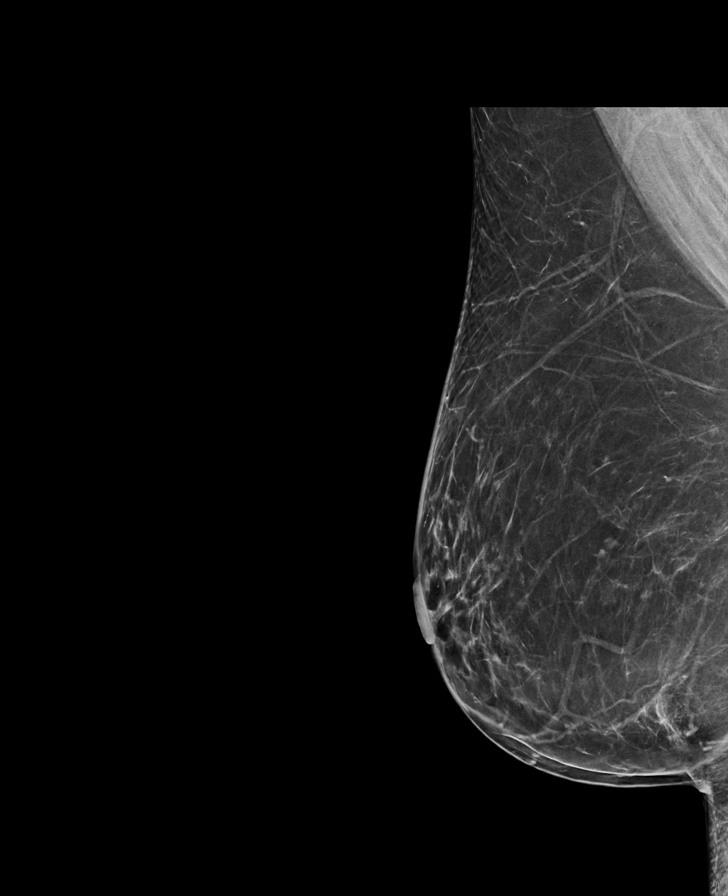

[R CC synth-2D]
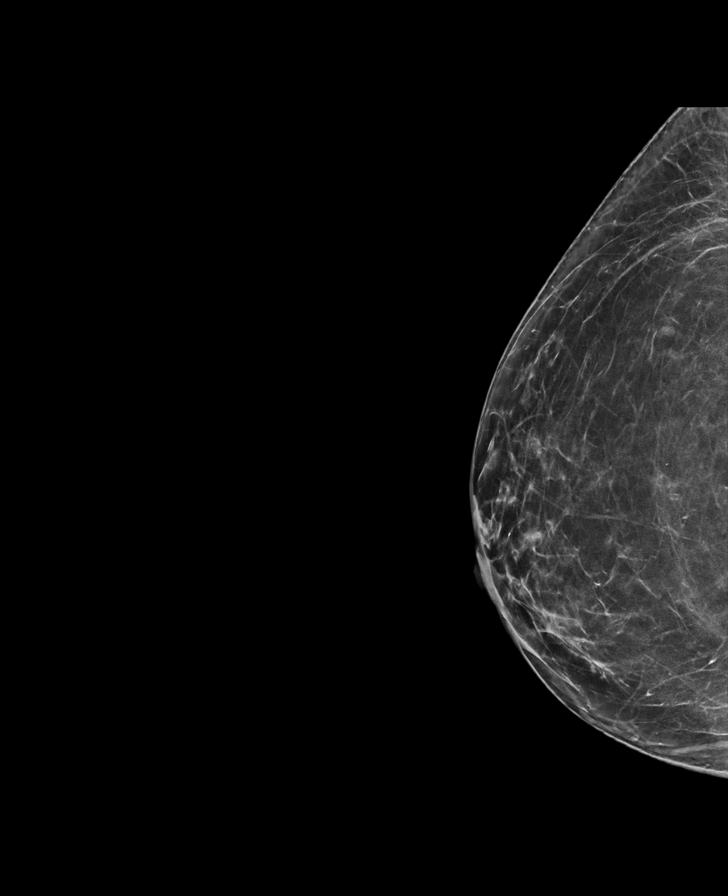

[L CC tomo · tomo slice 34/67.0]
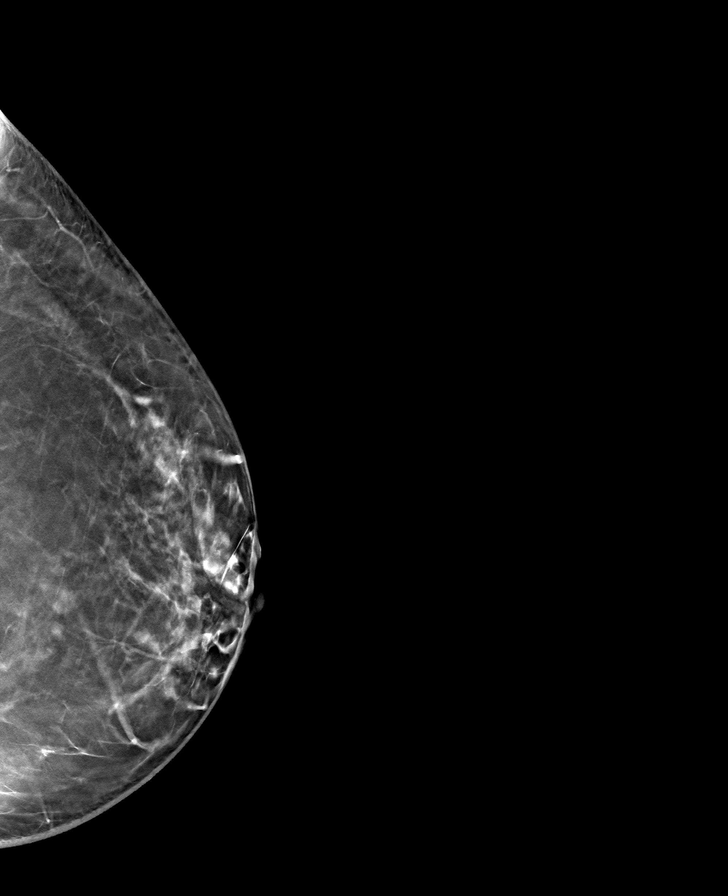

[R CC tomo · tomo slice 32/63.0]
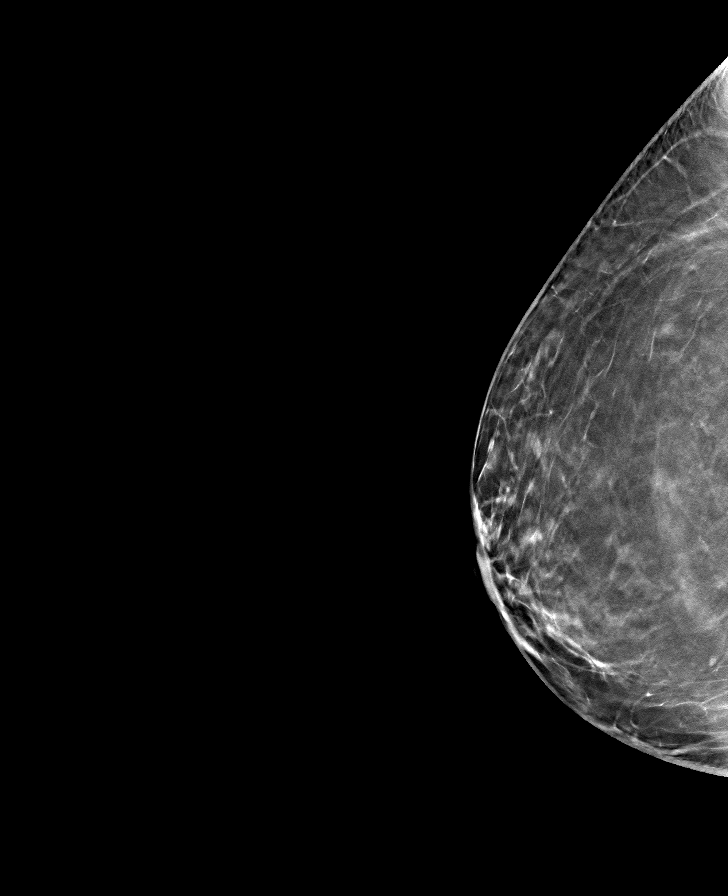

[L MLO tomo · tomo slice 39/77.0]
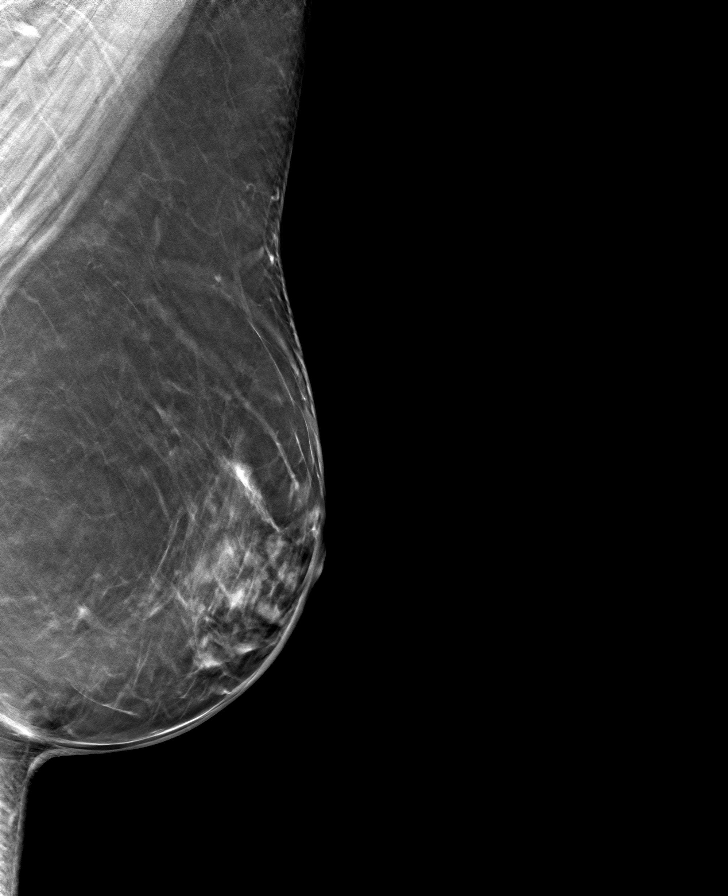

[R MLO tomo · tomo slice 40/79.0]
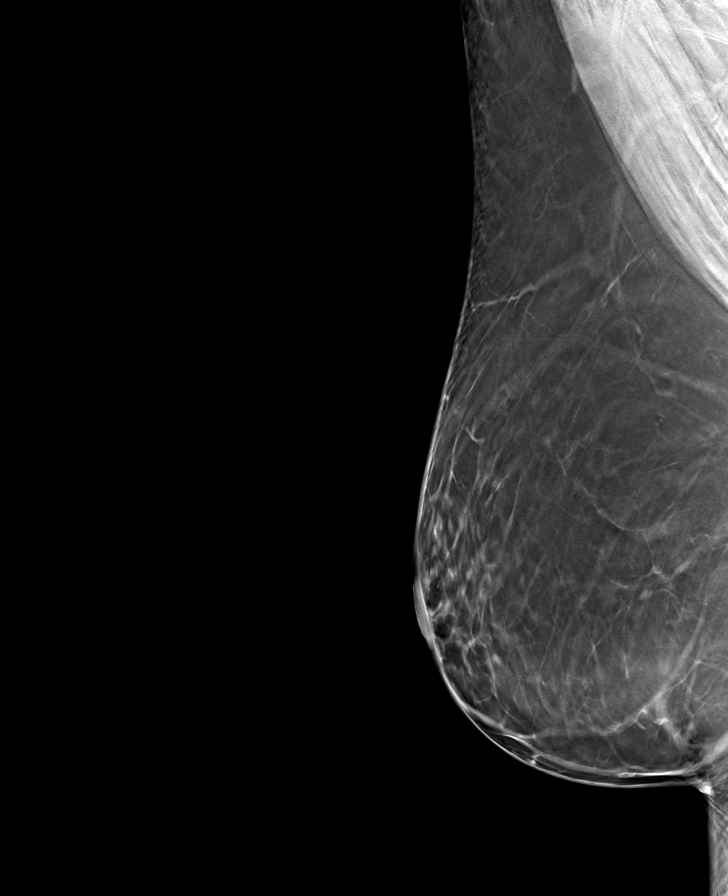

[8 of 24 positions shown; findings below may reference images not displayed]

ACR Breast Density Category b: There are scattered areas of
fibroglandular density.
FINDINGS: There are no findings suspicious for malignancy. The images were
evaluated with computer-aided detection.
IMPRESSION: No mammographic evidence of malignancy. A result letter of this
screening mammogram will be mailed directly to the patient.

RECOMMENDATION:
Screening mammogram in one year. (Code:WJ-I-BG6)

BI-RADS CATEGORY  1: Negative.

## 2022-01-02 ENCOUNTER — Ambulatory Visit: Payer: 59

## 2022-01-23 ENCOUNTER — Ambulatory Visit
Admission: RE | Admit: 2022-01-23 | Discharge: 2022-01-23 | Disposition: A | Discharge status: Home / Self Care | Payer: 59 | Source: Ambulatory Visit | Attending: Family | Admitting: Family

## 2022-01-23 DIAGNOSIS — Z1231 Encounter for screening mammogram for malignant neoplasm of breast: Secondary | ICD-10-CM

## 2022-11-01 IMAGING — RF DG CHOLANGIOGRAM OPERATIVE
1 series · 5 of 5 positions shown · non-contrast
Comparison: Right upper quadrant abdominal ultrasound earlier
today.

CLINICAL DATA: Cholecystectomy for acute cholecystitis.

EXAM:
INTRAOPERATIVE CHOLANGIOGRAM
TECHNIQUE: Cholangiographic images from the C-arm fluoroscopic device were
submitted for interpretation post-operatively. Please see the
procedural report for the amount of contrast and the fluoroscopy
time utilized.
FLUOROSCOPY:
Radiation Exposure Index (as provided by the fluoroscopic device):
4.2 mGy Kerma

[Series 1: run · 2 acquisitions, 5 frames shown]
[im 1/2]
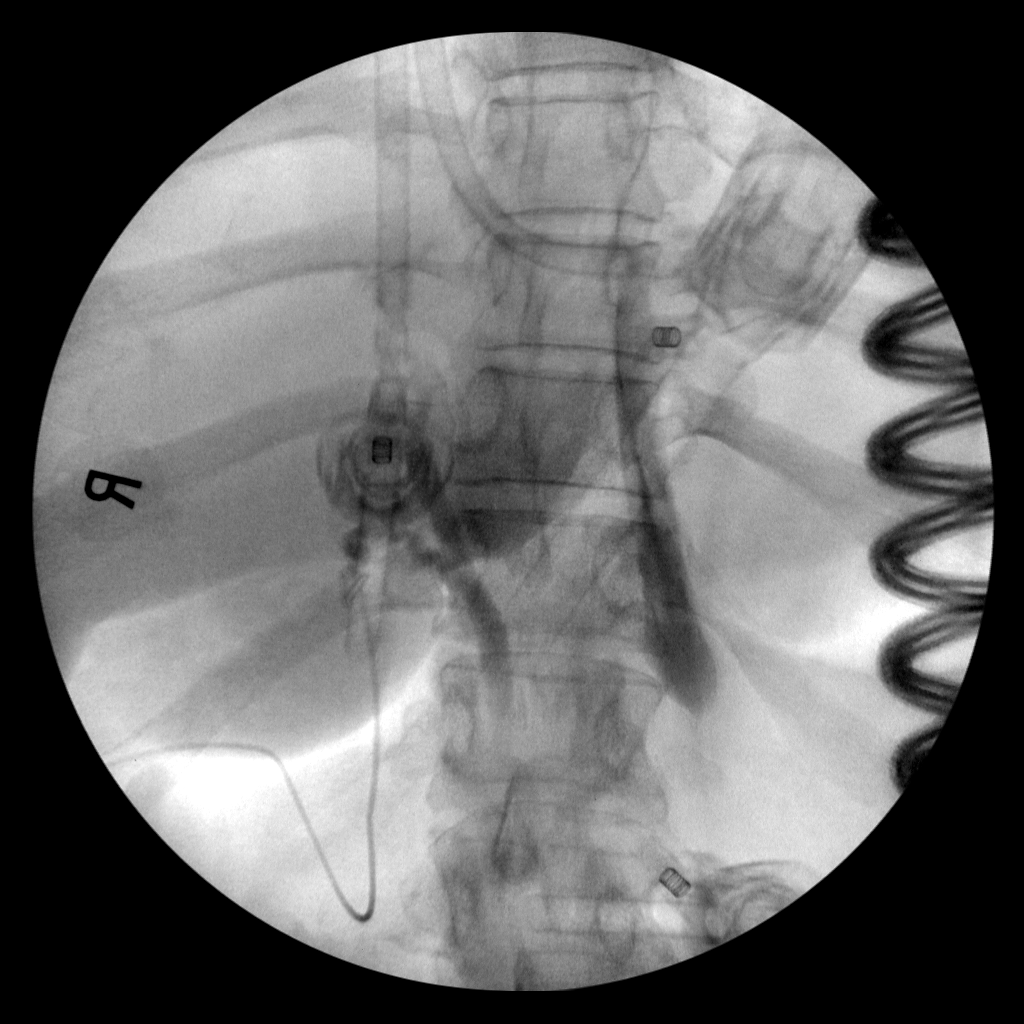
[im 1/2]
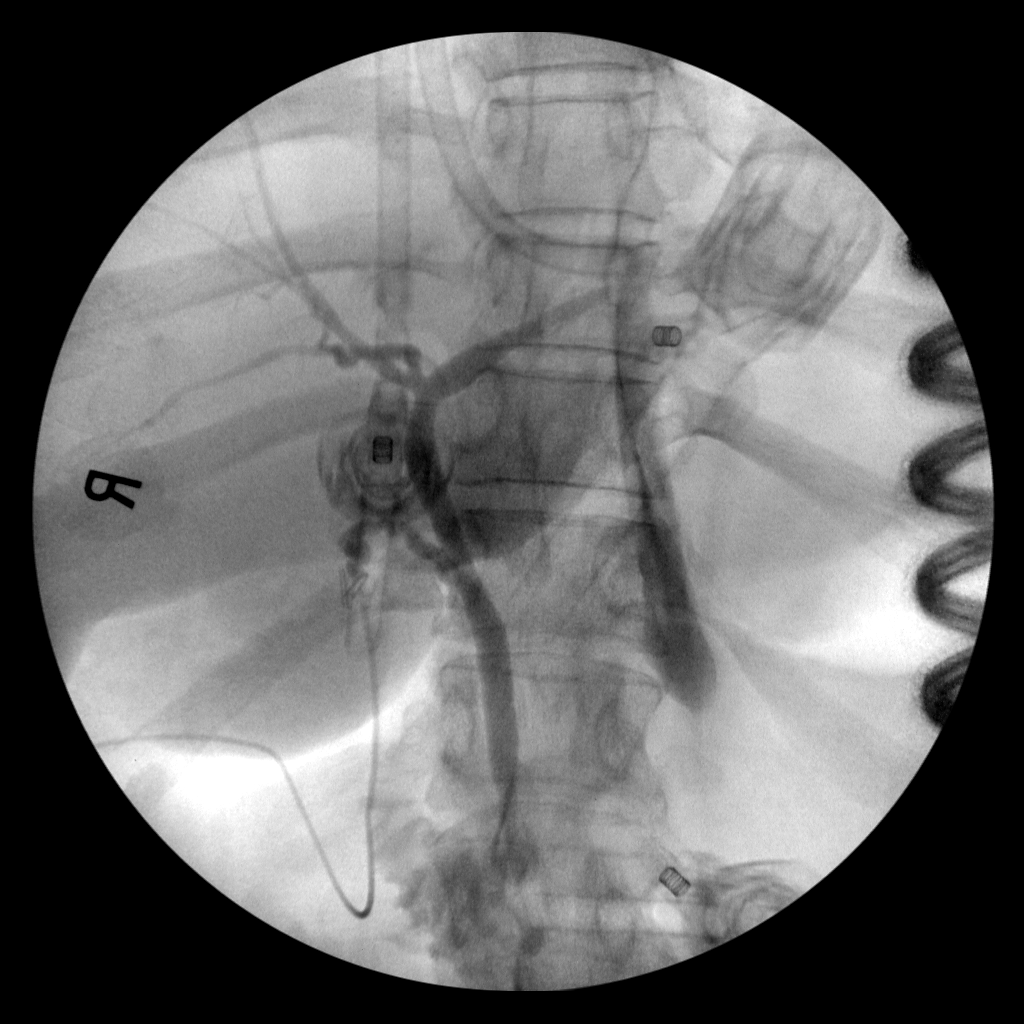
[im 1/2]
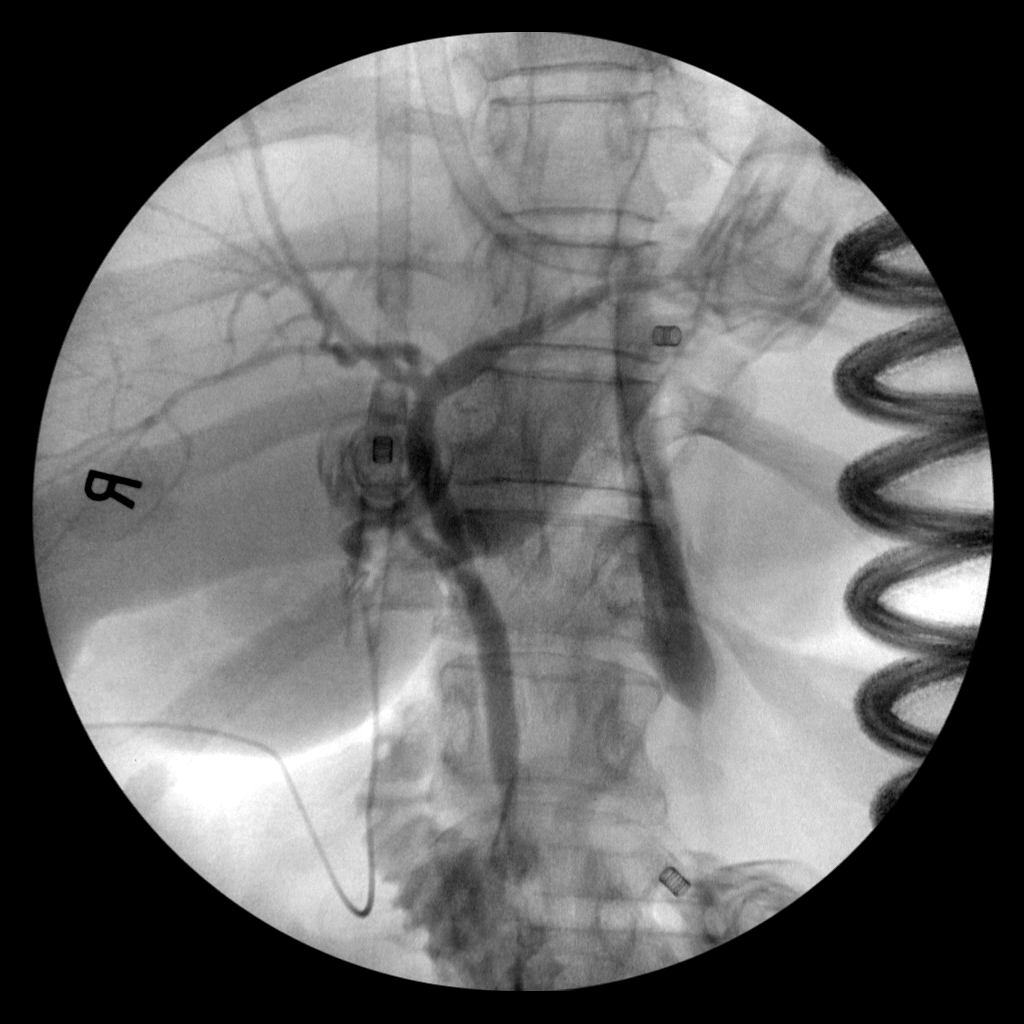
[im 1/2]
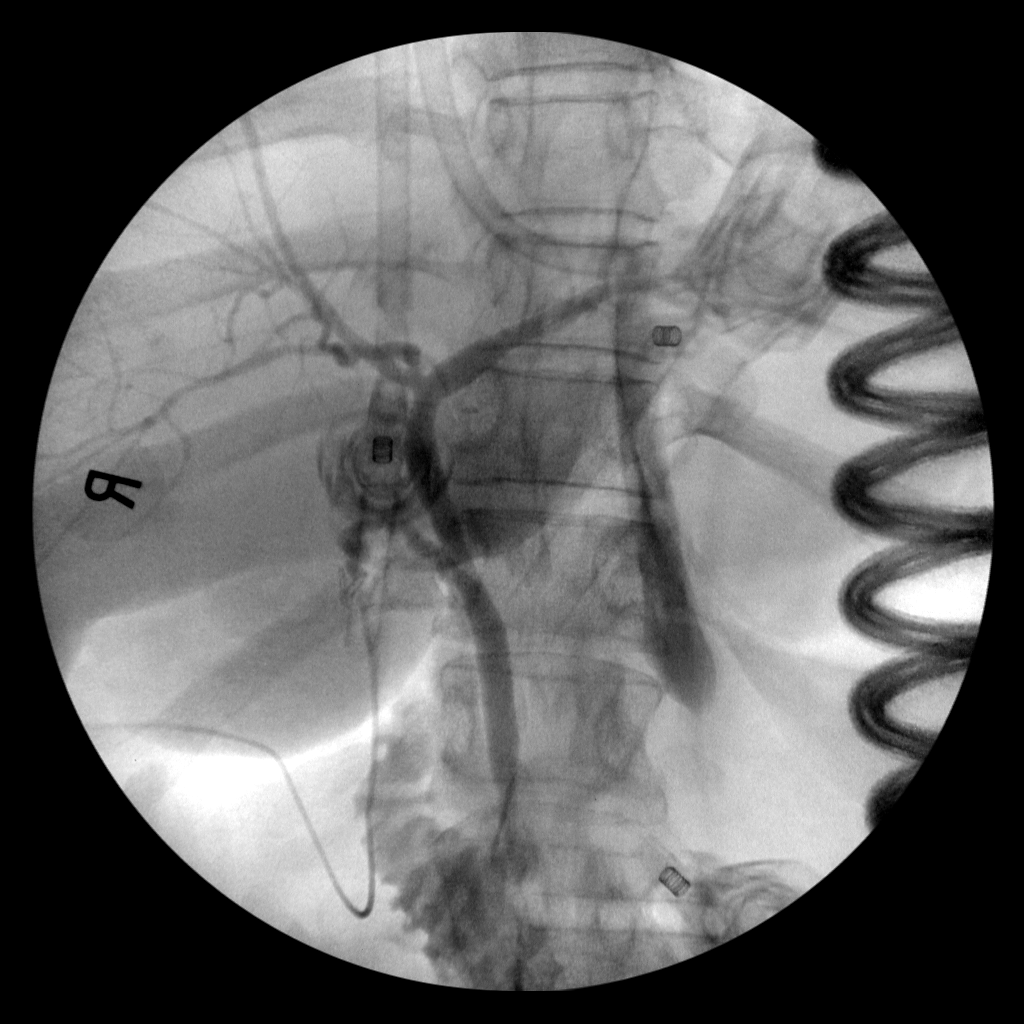
[im 2/2]
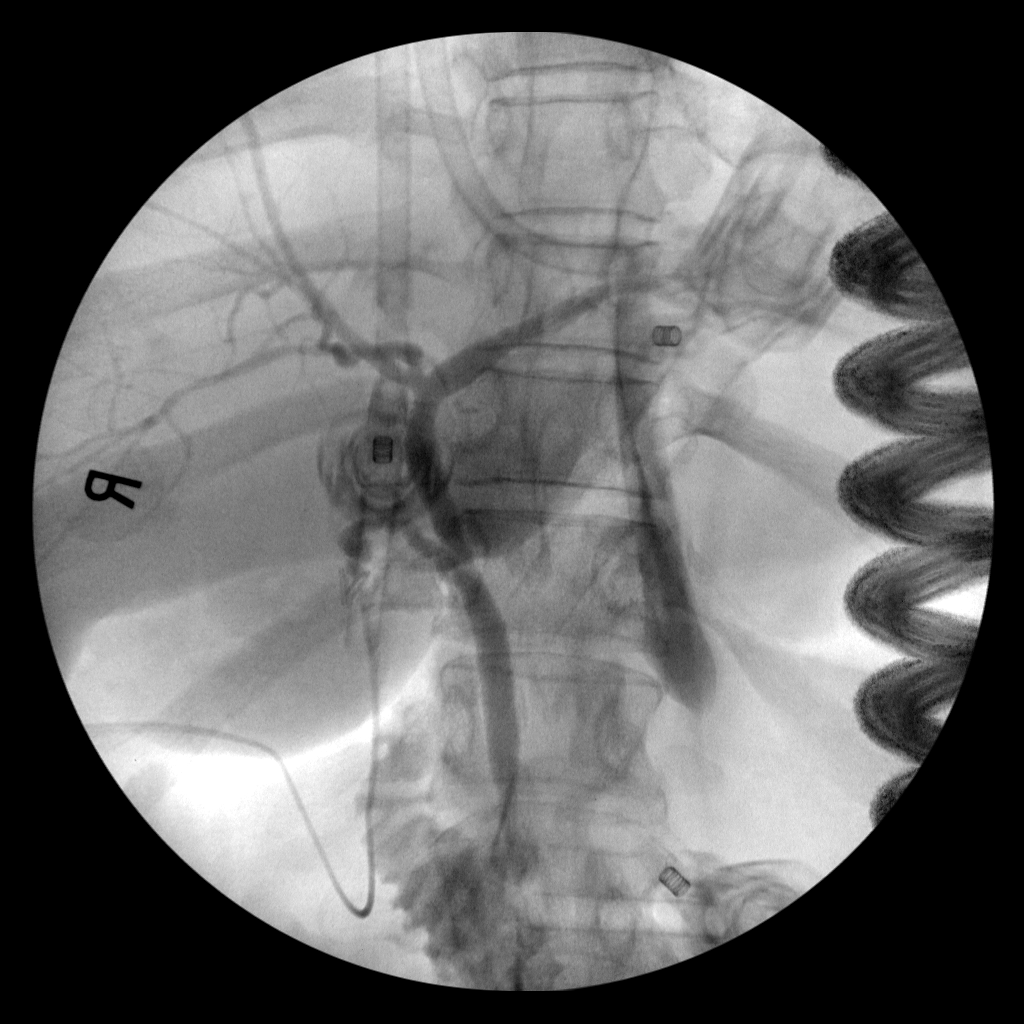

[5 of 5 positions shown; findings below may reference images not displayed]

FINDINGS: Intraoperative imaging with a C-arm demonstrates normal opacified
cystic duct stump, common bile duct and visualized intrahepatic
ducts. No evidence of biliary dilatation, stricture, filling defect
or contrast extravasation. Contrast enters the duodenum normally.
IMPRESSION: Normal intraoperative cholangiogram.
